# Patient Record
Sex: Male | Born: 1985 | Race: White | Hispanic: No | Marital: Single | Smoking: Current every day smoker
Health system: Southern US, Community
[De-identification: ages and names within clinical notes are randomized; demographics above are authoritative.]

## PROBLEM LIST (undated history)

## (undated) DIAGNOSIS — F32A Depression, unspecified: Secondary | ICD-10-CM

## (undated) DIAGNOSIS — F419 Anxiety disorder, unspecified: Secondary | ICD-10-CM

## (undated) DIAGNOSIS — F329 Major depressive disorder, single episode, unspecified: Secondary | ICD-10-CM

## (undated) DIAGNOSIS — J189 Pneumonia, unspecified organism: Secondary | ICD-10-CM

## (undated) DIAGNOSIS — J45909 Unspecified asthma, uncomplicated: Secondary | ICD-10-CM

## (undated) DIAGNOSIS — Z8489 Family history of other specified conditions: Secondary | ICD-10-CM

## (undated) DIAGNOSIS — IMO0001 Reserved for inherently not codable concepts without codable children: Secondary | ICD-10-CM

## (undated) DIAGNOSIS — G4730 Sleep apnea, unspecified: Secondary | ICD-10-CM

## (undated) DIAGNOSIS — R51 Headache: Secondary | ICD-10-CM

## (undated) DIAGNOSIS — R519 Headache, unspecified: Secondary | ICD-10-CM

---

## 2006-03-19 ENCOUNTER — Emergency Department: Payer: Self-pay | Admitting: Emergency Medicine

## 2006-03-22 ENCOUNTER — Other Ambulatory Visit: Payer: Self-pay

## 2006-03-22 ENCOUNTER — Inpatient Hospital Stay: Payer: Self-pay | Admitting: Internal Medicine

## 2006-03-25 ENCOUNTER — Other Ambulatory Visit: Payer: Self-pay

## 2008-03-20 ENCOUNTER — Emergency Department: Payer: Self-pay | Admitting: Unknown Physician Specialty

## 2009-03-02 ENCOUNTER — Emergency Department: Payer: Self-pay | Admitting: Internal Medicine

## 2011-08-04 ENCOUNTER — Emergency Department: Payer: Self-pay | Admitting: Emergency Medicine

## 2011-08-17 ENCOUNTER — Emergency Department: Payer: Self-pay | Admitting: *Deleted

## 2012-07-01 ENCOUNTER — Emergency Department: Payer: Self-pay | Admitting: Emergency Medicine

## 2013-04-30 ENCOUNTER — Emergency Department: Payer: Self-pay | Admitting: Emergency Medicine

## 2013-07-27 ENCOUNTER — Emergency Department: Payer: Self-pay | Admitting: Emergency Medicine

## 2013-09-30 ENCOUNTER — Emergency Department: Payer: Self-pay | Admitting: Emergency Medicine

## 2014-03-13 ENCOUNTER — Encounter (HOSPITAL_COMMUNITY): Payer: Self-pay | Admitting: Emergency Medicine

## 2014-03-13 ENCOUNTER — Emergency Department (HOSPITAL_COMMUNITY): Payer: Self-pay

## 2014-03-13 ENCOUNTER — Inpatient Hospital Stay (HOSPITAL_COMMUNITY)
Admission: EM | Admit: 2014-03-13 | Discharge: 2014-03-16 | DRG: 493 | Disposition: A | Discharge status: Home / Self Care | Payer: Self-pay | Attending: General Surgery | Admitting: General Surgery

## 2014-03-13 DIAGNOSIS — S32049A Unspecified fracture of fourth lumbar vertebra, initial encounter for closed fracture: Secondary | ICD-10-CM | POA: Diagnosis present

## 2014-03-13 DIAGNOSIS — S82201A Unspecified fracture of shaft of right tibia, initial encounter for closed fracture: Secondary | ICD-10-CM | POA: Diagnosis present

## 2014-03-13 DIAGNOSIS — Z419 Encounter for procedure for purposes other than remedying health state, unspecified: Secondary | ICD-10-CM

## 2014-03-13 DIAGNOSIS — S32009A Unspecified fracture of unspecified lumbar vertebra, initial encounter for closed fracture: Secondary | ICD-10-CM | POA: Diagnosis present

## 2014-03-13 DIAGNOSIS — R52 Pain, unspecified: Secondary | ICD-10-CM

## 2014-03-13 DIAGNOSIS — S32039A Unspecified fracture of third lumbar vertebra, initial encounter for closed fracture: Secondary | ICD-10-CM | POA: Diagnosis present

## 2014-03-13 DIAGNOSIS — S82141A Displaced bicondylar fracture of right tibia, initial encounter for closed fracture: Principal | ICD-10-CM | POA: Diagnosis present

## 2014-03-13 DIAGNOSIS — F329 Major depressive disorder, single episode, unspecified: Secondary | ICD-10-CM | POA: Diagnosis present

## 2014-03-13 DIAGNOSIS — J45909 Unspecified asthma, uncomplicated: Secondary | ICD-10-CM | POA: Diagnosis present

## 2014-03-13 DIAGNOSIS — J9811 Atelectasis: Secondary | ICD-10-CM | POA: Diagnosis not present

## 2014-03-13 DIAGNOSIS — D62 Acute posthemorrhagic anemia: Secondary | ICD-10-CM | POA: Diagnosis not present

## 2014-03-13 DIAGNOSIS — F419 Anxiety disorder, unspecified: Secondary | ICD-10-CM | POA: Diagnosis present

## 2014-03-13 DIAGNOSIS — T07XXXA Unspecified multiple injuries, initial encounter: Secondary | ICD-10-CM

## 2014-03-13 HISTORY — DX: Headache, unspecified: R51.9

## 2014-03-13 HISTORY — DX: Major depressive disorder, single episode, unspecified: F32.9

## 2014-03-13 HISTORY — DX: Headache: R51

## 2014-03-13 HISTORY — DX: Anxiety disorder, unspecified: F41.9

## 2014-03-13 HISTORY — DX: Depression, unspecified: F32.A

## 2014-03-13 HISTORY — DX: Family history of other specified conditions: Z84.89

## 2014-03-13 HISTORY — DX: Unspecified asthma, uncomplicated: J45.909

## 2014-03-13 HISTORY — DX: Pneumonia, unspecified organism: J18.9

## 2014-03-13 HISTORY — DX: Reserved for inherently not codable concepts without codable children: IMO0001

## 2014-03-13 HISTORY — DX: Sleep apnea, unspecified: G47.30

## 2014-03-13 LAB — BASIC METABOLIC PANEL
ANION GAP: 11 (ref 5–15)
BUN: 11 mg/dL (ref 6–23)
CALCIUM: 9.5 mg/dL (ref 8.4–10.5)
CO2: 23 mmol/L (ref 19–32)
CREATININE: 1.13 mg/dL (ref 0.50–1.35)
Chloride: 104 mmol/L (ref 96–112)
GFR calc Af Amer: >90 mL/min (ref >90)
GFR, EST NON AFRICAN AMERICAN: 88 mL/min — AB (ref >90)
Glucose, Bld: 131 mg/dL — ABNORMAL HIGH (ref 70–99)
Potassium: 3.9 mmol/L (ref 3.5–5.1)
SODIUM: 138 mmol/L (ref 135–145)

## 2014-03-13 LAB — CBC WITH DIFFERENTIAL/PLATELET
Basophils Absolute: 0.1 10*3/uL (ref 0.0–0.1)
Basophils Relative: 0 % (ref 0–1)
EOS ABS: 0.5 10*3/uL (ref 0.0–0.7)
EOS PCT: 2 % (ref 0–5)
HCT: 43.2 % (ref 39.0–52.0)
Hemoglobin: 15 g/dL (ref 13.0–17.0)
Lymphocytes Relative: 16 % (ref 12–46)
Lymphs Abs: 4.4 10*3/uL — ABNORMAL HIGH (ref 0.7–4.0)
MCH: 32.1 pg (ref 26.0–34.0)
MCHC: 34.7 g/dL (ref 30.0–36.0)
MCV: 92.3 fL (ref 78.0–100.0)
Monocytes Absolute: 1.3 10*3/uL — ABNORMAL HIGH (ref 0.1–1.0)
Monocytes Relative: 5 % (ref 3–12)
NEUTROS PCT: 78 % — AB (ref 43–77)
Neutro Abs: 22.2 10*3/uL — ABNORMAL HIGH (ref 1.7–7.7)
PLATELETS: 317 10*3/uL (ref 150–400)
RBC: 4.68 MIL/uL (ref 4.22–5.81)
RDW: 12.7 % (ref 11.5–15.5)
WBC: 28.6 10*3/uL — AB (ref 4.0–10.5)

## 2014-03-13 LAB — SAMPLE TO BLOOD BANK

## 2014-03-13 MED ORDER — TETANUS-DIPHTH-ACELL PERTUSSIS 5-2.5-18.5 LF-MCG/0.5 IM SUSP
0.5000 mL | Freq: Once | INTRAMUSCULAR | Status: DC
  Filled 2014-03-13: qty 0.5

## 2014-03-13 MED ORDER — HYDROMORPHONE HCL 1 MG/ML IJ SOLN
1.0000 mg | Freq: Once | INTRAMUSCULAR | Status: AC
  Administered 2014-03-13: 1 mg via INTRAVENOUS
  Filled 2014-03-13: qty 1

## 2014-03-13 MED ORDER — HYDROMORPHONE HCL 1 MG/ML IJ SOLN
1.0000 mg | Freq: Once | INTRAMUSCULAR | Status: AC
  Administered 2014-03-13: 1 mg via INTRAVENOUS

## 2014-03-13 MED ORDER — ONDANSETRON HCL 4 MG/2ML IJ SOLN
INTRAMUSCULAR | Status: AC
  Administered 2014-03-13: 4 mg via INTRAVENOUS
  Filled 2014-03-13: qty 2

## 2014-03-13 MED ORDER — SODIUM CHLORIDE 0.9 % IV BOLUS (SEPSIS)
1000.0000 mL | Freq: Once | INTRAVENOUS | Status: AC
Start: 2014-03-13 — End: 2014-03-14
  Administered 2014-03-13: 1000 mL via INTRAVENOUS

## 2014-03-13 MED ORDER — HYDROMORPHONE HCL 1 MG/ML IJ SOLN
INTRAMUSCULAR | Status: AC
Start: 2014-03-13 — End: 2014-03-14
  Filled 2014-03-13: qty 1

## 2014-03-13 MED ORDER — ONDANSETRON HCL 4 MG/2ML IJ SOLN
4.0000 mg | Freq: Once | INTRAMUSCULAR | Status: AC
  Administered 2014-03-13: 4 mg via INTRAVENOUS

## 2014-03-13 MED ORDER — SODIUM CHLORIDE 0.9 % IV SOLN
Freq: Once | INTRAVENOUS | Status: AC
  Administered 2014-03-13: 21:00:00 via INTRAVENOUS

## 2014-03-13 MED ORDER — HYDROMORPHONE HCL 1 MG/ML IJ SOLN
INTRAMUSCULAR | Status: AC
  Filled 2014-03-13: qty 1

## 2014-03-13 MED ORDER — IOHEXOL 300 MG/ML  SOLN
100.0000 mL | Freq: Once | INTRAMUSCULAR | Status: AC | PRN
  Administered 2014-03-13: 100 mL via INTRAVENOUS

## 2014-03-13 MED ORDER — SODIUM CHLORIDE 0.9 % IV BOLUS (SEPSIS)
1000.0000 mL | Freq: Once | INTRAVENOUS | Status: AC
  Administered 2014-03-13: 1000 mL via INTRAVENOUS
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
## 2014-03-13 NOTE — ED Notes (Signed)
Ortho at bedside placing immobilizer on right leg

## 2014-03-13 NOTE — ED Notes (Signed)
Pt given IV fentanyl by EMS, no LOC

## 2014-03-13 NOTE — ED Notes (Signed)
Pt has good pulses in right foot.

## 2014-03-13 NOTE — ED Notes (Signed)
X-ray at bedside

## 2014-03-13 NOTE — ED Notes (Signed)
Good pulses on left leg, cap refill less than three seconds on left leg. Sensation intact.

## 2014-03-13 NOTE — ED Notes (Signed)
Dr. Erma HeritageIsaacs notified of pt HR.
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
## 2014-03-13 NOTE — Progress Notes (Signed)
Orthopedic Tech Progress Note Patient Details:  Mark FlorChristopher S Carroll 1985/12/16 [id number]  Ortho Devices Type of Ortho Device: Knee Immobilizer Ortho Device/Splint Location: RLE Ortho Device/Splint Interventions: Ordered, Application   Jennye MoccasinHughes, Shaelyn Decarli Craig 03/13/2014, 9:32 PM

## 2014-03-13 NOTE — ED Notes (Signed)
Xray setting up portable xray

## 2014-03-13 NOTE — ED Notes (Signed)
Attempted 2nd IV

## 2014-03-13 NOTE — ED Notes (Signed)
Pt returned from CT transported by this RN. No adverse events in radiology.

## 2014-03-13 NOTE — ED Notes (Signed)
Pt monitored by pulse ox, bp cuff, and 12-lead. 
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
## 2014-03-13 NOTE — ED Notes (Signed)
Pt saturations 88% placed on 2L [place]. Sats 99% on 2L [place].

## 2014-03-13 NOTE — ED Notes (Signed)
Aspen collar applied

## 2014-03-13 NOTE — ED Notes (Signed)
Pulses in right foot are strong.

## 2014-03-13 NOTE — ED Notes (Signed)
This nurse transported pt to CT.
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
## 2014-03-13 NOTE — ED Notes (Signed)
CT notified that patient needs scans STAT.

## 2014-03-13 NOTE — ED Notes (Signed)
Aspen Collar removed by EDP

## 2014-03-14 ENCOUNTER — Inpatient Hospital Stay (HOSPITAL_COMMUNITY): Payer: Self-pay

## 2014-03-14 ENCOUNTER — Encounter (HOSPITAL_COMMUNITY): Payer: Self-pay | Admitting: *Deleted

## 2014-03-14 ENCOUNTER — Inpatient Hospital Stay (HOSPITAL_COMMUNITY): Payer: Self-pay | Admitting: Anesthesiology

## 2014-03-14 ENCOUNTER — Encounter (HOSPITAL_COMMUNITY): Admission: EM | Disposition: A | Discharge status: Home / Self Care | Payer: Self-pay | Source: Home / Self Care

## 2014-03-14 ENCOUNTER — Inpatient Hospital Stay (HOSPITAL_COMMUNITY): Payer: No Typology Code available for payment source | Admitting: Anesthesiology

## 2014-03-14 DIAGNOSIS — S82201A Unspecified fracture of shaft of right tibia, initial encounter for closed fracture: Secondary | ICD-10-CM | POA: Diagnosis present

## 2014-03-14 HISTORY — PX: TIBIA IM NAIL INSERTION: SHX2516

## 2014-03-14 LAB — SURGICAL PCR SCREEN
MRSA, PCR: NEGATIVE
Staphylococcus aureus: POSITIVE — AB

## 2014-03-14 LAB — URINALYSIS, ROUTINE W REFLEX MICROSCOPIC
BILIRUBIN URINE: NEGATIVE
GLUCOSE, UA: NEGATIVE mg/dL
HGB URINE DIPSTICK: NEGATIVE
KETONES UR: NEGATIVE mg/dL
Leukocytes, UA: NEGATIVE
NITRITE: NEGATIVE
PROTEIN: NEGATIVE mg/dL
Specific Gravity, Urine: 1.043 — ABNORMAL HIGH (ref 1.005–1.030)
Urobilinogen, UA: 0.2 mg/dL (ref 0.0–1.0)
pH: 6 (ref 5.0–8.0)

## 2014-03-14 SURGERY — INSERTION, INTRAMEDULLARY ROD, TIBIA
Anesthesia: General | Site: Leg Lower | Laterality: Right

## 2014-03-14 MED ORDER — CEFAZOLIN SODIUM-DEXTROSE 2-3 GM-% IV SOLR
2.0000 g | INTRAVENOUS | Status: AC
  Administered 2014-03-14: 2 g via INTRAVENOUS
  Filled 2014-03-14: qty 50

## 2014-03-14 MED ORDER — NEOSTIGMINE METHYLSULFATE 10 MG/10ML IV SOLN
INTRAVENOUS | Status: AC
  Filled 2014-03-14: qty 4

## 2014-03-14 MED ORDER — PANTOPRAZOLE SODIUM 40 MG IV SOLR
40.0000 mg | Freq: Every day | INTRAVENOUS | Status: DC
  Administered 2014-03-14: 40 mg via INTRAVENOUS
  Filled 2014-03-14: qty 40

## 2014-03-14 MED ORDER — MIDAZOLAM HCL 5 MG/5ML IJ SOLN
INTRAMUSCULAR | Status: DC | PRN
  Administered 2014-03-14: 2 mg via INTRAVENOUS

## 2014-03-14 MED ORDER — BUPIVACAINE HCL (PF) 0.25 % IJ SOLN
INTRAMUSCULAR | Status: AC
  Filled 2014-03-14: qty 30

## 2014-03-14 MED ORDER — HYDROMORPHONE HCL 1 MG/ML IJ SOLN
1.0000 mg | Freq: Once | INTRAMUSCULAR | Status: AC
  Administered 2014-03-14: 1 mg via INTRAVENOUS
  Filled 2014-03-14: qty 1

## 2014-03-14 MED ORDER — FENTANYL CITRATE 0.05 MG/ML IJ SOLN
INTRAMUSCULAR | Status: AC
  Filled 2014-03-14: qty 5

## 2014-03-14 MED ORDER — ROCURONIUM BROMIDE 50 MG/5ML IV SOLN
INTRAVENOUS | Status: AC
  Filled 2014-03-14: qty 1

## 2014-03-14 MED ORDER — OXYCODONE HCL 5 MG PO TABS
5.0000 mg | ORAL_TABLET | ORAL | Status: DC | PRN
  Administered 2014-03-14 – 2014-03-15 (×3): 10 mg via ORAL
  Filled 2014-03-14 (×3): qty 2

## 2014-03-14 MED ORDER — ENOXAPARIN SODIUM 40 MG/0.4ML ~~LOC~~ SOLN
40.0000 mg | SUBCUTANEOUS | Status: DC
  Administered 2014-03-15: 40 mg via SUBCUTANEOUS
  Filled 2014-03-14 (×2): qty 0.4

## 2014-03-14 MED ORDER — FENTANYL CITRATE 0.05 MG/ML IJ SOLN
INTRAMUSCULAR | Status: DC | PRN
  Administered 2014-03-14: 150 ug via INTRAVENOUS
  Administered 2014-03-14 (×2): 100 ug via INTRAVENOUS
  Administered 2014-03-14 (×3): 50 ug via INTRAVENOUS

## 2014-03-14 MED ORDER — MIDAZOLAM HCL 2 MG/2ML IJ SOLN
INTRAMUSCULAR | Status: AC
  Filled 2014-03-14: qty 2

## 2014-03-14 MED ORDER — KETOROLAC TROMETHAMINE 15 MG/ML IJ SOLN
15.0000 mg | Freq: Three times a day (TID) | INTRAMUSCULAR | Status: DC
  Administered 2014-03-14 – 2014-03-15 (×3): 15 mg via INTRAVENOUS
  Filled 2014-03-14 (×3): qty 1

## 2014-03-14 MED ORDER — PROPOFOL 10 MG/ML IV BOLUS
INTRAVENOUS | Status: AC
  Filled 2014-03-14: qty 20

## 2014-03-14 MED ORDER — PROMETHAZINE HCL 25 MG/ML IJ SOLN: 6.2500 mg | INTRAMUSCULAR | Status: DC | PRN

## 2014-03-14 MED ORDER — ONDANSETRON HCL 4 MG PO TABS: 4.0000 mg | ORAL_TABLET | Freq: Four times a day (QID) | ORAL | Status: DC | PRN

## 2014-03-14 MED ORDER — ONDANSETRON HCL 4 MG/2ML IJ SOLN
INTRAMUSCULAR | Status: DC | PRN
  Administered 2014-03-14: 4 mg via INTRAVENOUS

## 2014-03-14 MED ORDER — HYDROMORPHONE HCL 1 MG/ML IJ SOLN
INTRAMUSCULAR | Status: AC
  Filled 2014-03-14: qty 1

## 2014-03-14 MED ORDER — GLYCOPYRROLATE 0.2 MG/ML IJ SOLN
INTRAMUSCULAR | Status: AC
  Filled 2014-03-14: qty 2

## 2014-03-14 MED ORDER — MIDAZOLAM HCL 2 MG/2ML IJ SOLN: 0.5000 mg | Freq: Once | INTRAMUSCULAR | Status: DC | PRN

## 2014-03-14 MED ORDER — CHLORHEXIDINE GLUCONATE 4 % EX LIQD
60.0000 mL | Freq: Once | CUTANEOUS | Status: DC
  Filled 2014-03-14: qty 60

## 2014-03-14 MED ORDER — LIDOCAINE HCL (CARDIAC) 20 MG/ML IV SOLN
INTRAVENOUS | Status: DC | PRN
  Administered 2014-03-14: 20 mg via INTRAVENOUS

## 2014-03-14 MED ORDER — LACTATED RINGERS IV SOLN
INTRAVENOUS | Status: DC | PRN
  Administered 2014-03-14 (×2): via INTRAVENOUS

## 2014-03-14 MED ORDER — HYDROMORPHONE HCL 1 MG/ML IJ SOLN
1.0000 mg | INTRAMUSCULAR | Status: DC | PRN
  Administered 2014-03-14 – 2014-03-15 (×7): 2 mg via INTRAVENOUS
  Filled 2014-03-14 (×7): qty 2

## 2014-03-14 MED ORDER — PANTOPRAZOLE SODIUM 40 MG PO TBEC
40.0000 mg | DELAYED_RELEASE_TABLET | Freq: Every day | ORAL | Status: DC
Start: 2014-03-14 — End: 2014-03-16
  Administered 2014-03-15 – 2014-03-16 (×2): 40 mg via ORAL
  Filled 2014-03-14 (×2): qty 1

## 2014-03-14 MED ORDER — CEFAZOLIN SODIUM-DEXTROSE 2-3 GM-% IV SOLR
2.0000 g | Freq: Three times a day (TID) | INTRAVENOUS | Status: AC
  Administered 2014-03-14 – 2014-03-15 (×3): 2 g via INTRAVENOUS
  Filled 2014-03-14 (×3): qty 50

## 2014-03-14 MED ORDER — GLYCOPYRROLATE 0.2 MG/ML IJ SOLN
INTRAMUSCULAR | Status: DC | PRN
  Administered 2014-03-14: 0.4 mg via INTRAVENOUS

## 2014-03-14 MED ORDER — ONDANSETRON HCL 4 MG/2ML IJ SOLN: 4.0000 mg | Freq: Four times a day (QID) | INTRAMUSCULAR | Status: DC | PRN

## 2014-03-14 MED ORDER — POLYETHYLENE GLYCOL 3350 17 G PO PACK: 17.0000 g | PACK | Freq: Two times a day (BID) | ORAL | Status: DC | PRN

## 2014-03-14 MED ORDER — 0.9 % SODIUM CHLORIDE (POUR BTL) OPTIME
TOPICAL | Status: DC | PRN
  Administered 2014-03-14: 1000 mL

## 2014-03-14 MED ORDER — NEOSTIGMINE METHYLSULFATE 10 MG/10ML IV SOLN
INTRAVENOUS | Status: DC | PRN
  Administered 2014-03-14: 3 mg via INTRAVENOUS

## 2014-03-14 MED ORDER — MUPIROCIN 2 % EX OINT: 1.0000 "application " | TOPICAL_OINTMENT | Freq: Once | CUTANEOUS | Status: DC

## 2014-03-14 MED ORDER — ONDANSETRON HCL 4 MG/2ML IJ SOLN
INTRAMUSCULAR | Status: AC
  Filled 2014-03-14: qty 2

## 2014-03-14 MED ORDER — MEPERIDINE HCL 25 MG/ML IJ SOLN: 6.2500 mg | INTRAMUSCULAR | Status: DC | PRN

## 2014-03-14 MED ORDER — HYDROCODONE-ACETAMINOPHEN 5-325 MG PO TABS: 2.0000 | ORAL_TABLET | ORAL | Status: DC | PRN

## 2014-03-14 MED ORDER — SUCCINYLCHOLINE CHLORIDE 20 MG/ML IJ SOLN
INTRAMUSCULAR | Status: AC
  Filled 2014-03-14: qty 1

## 2014-03-14 MED ORDER — PROPOFOL 10 MG/ML IV BOLUS
INTRAVENOUS | Status: DC | PRN
  Administered 2014-03-14: 200 mg via INTRAVENOUS

## 2014-03-14 MED ORDER — SODIUM CHLORIDE 0.9 % IV SOLN
INTRAVENOUS | Status: DC
  Administered 2014-03-14 (×2): via INTRAVENOUS

## 2014-03-14 MED ORDER — HYDROMORPHONE HCL 1 MG/ML IJ SOLN
0.2500 mg | INTRAMUSCULAR | Status: DC | PRN
  Administered 2014-03-14 (×4): 0.5 mg via INTRAVENOUS

## 2014-03-14 MED ORDER — DEXTROSE 50 % IV SOLN
INTRAVENOUS | Status: AC
  Filled 2014-03-14: qty 50

## 2014-03-14 MED ORDER — DOCUSATE SODIUM 100 MG PO CAPS
100.0000 mg | ORAL_CAPSULE | Freq: Two times a day (BID) | ORAL | Status: DC
  Administered 2014-03-14 – 2014-03-16 (×4): 100 mg via ORAL
  Filled 2014-03-14 (×4): qty 1

## 2014-03-14 MED ORDER — ROCURONIUM BROMIDE 100 MG/10ML IV SOLN
INTRAVENOUS | Status: DC | PRN
  Administered 2014-03-14: 50 mg via INTRAVENOUS

## 2014-03-14 MED ORDER — MUPIROCIN 2 % EX OINT
TOPICAL_OINTMENT | CUTANEOUS | Status: AC
  Administered 2014-03-14: 11:00:00
  Filled 2014-03-14: qty 22

## 2014-03-14 SURGICAL SUPPLY — 68 items
BANDAGE ELASTIC 4 VELCRO ST LF (GAUZE/BANDAGES/DRESSINGS) ×3 IMPLANT
BANDAGE ELASTIC 6 VELCRO ST LF (GAUZE/BANDAGES/DRESSINGS) ×3 IMPLANT
BANDAGE ESMARK 6X9 LF (GAUZE/BANDAGES/DRESSINGS) ×1 IMPLANT
BEAD TIP GUIDEWIRE 2.6MM X 80 CM ×3 IMPLANT
BIOMET TRAUMA BEAD TIP GUIDEWIRE 2.6MMX80CM ×3 IMPLANT
BIT DRILL CALIBRATED 4.3X320MM (BIT) ×1 IMPLANT
BIT DRILL CROWE POINT TWST 4.3 (DRILL) ×1 IMPLANT
BLADE SURG 15 STRL LF DISP TIS (BLADE) ×1 IMPLANT
BLADE SURG 15 STRL SS (BLADE) ×2
BLADE SURG ROTATE 9660 (MISCELLANEOUS) ×3 IMPLANT
BNDG COHESIVE 6X5 TAN STRL LF (GAUZE/BANDAGES/DRESSINGS) ×3 IMPLANT
BNDG ESMARK 6X9 LF (GAUZE/BANDAGES/DRESSINGS) ×3
BNDG GAUZE ELAST 4 BULKY (GAUZE/BANDAGES/DRESSINGS) ×3 IMPLANT
CUFF TOURNIQUET SINGLE 34IN LL (TOURNIQUET CUFF) ×3 IMPLANT
CUFF TOURNIQUET SINGLE 44IN (TOURNIQUET CUFF) IMPLANT
DRAPE C-ARM 42X72 X-RAY (DRAPES) ×3 IMPLANT
DRAPE C-ARMOR (DRAPES) ×3 IMPLANT
DRAPE IMP U-DRAPE 54X76 (DRAPES) ×3 IMPLANT
DRAPE ORTHO SPLIT 77X108 STRL (DRAPES) ×4
DRAPE PROXIMA HALF (DRAPES) ×6 IMPLANT
DRAPE SURG ORHT 6 SPLT 77X108 (DRAPES) ×2 IMPLANT
DRAPE U-SHAPE 47X51 STRL (DRAPES) ×3 IMPLANT
DRILL CALIBRATED 4.3X320MM (BIT) ×3
DRILL CROWE POINT TWIST 4.3 (DRILL) ×3
DURAPREP 26ML APPLICATOR (WOUND CARE) ×6 IMPLANT
ELECT REM PT RETURN 9FT ADLT (ELECTROSURGICAL) ×3
ELECTRODE REM PT RTRN 9FT ADLT (ELECTROSURGICAL) ×1 IMPLANT
GAUZE SPONGE 4X4 12PLY STRL (GAUZE/BANDAGES/DRESSINGS) ×6 IMPLANT
GAUZE XEROFORM 1X8 LF (GAUZE/BANDAGES/DRESSINGS) IMPLANT
GAUZE XEROFORM 5X9 LF (GAUZE/BANDAGES/DRESSINGS) ×3 IMPLANT
GLOVE BIOGEL PI IND STRL 8 (GLOVE) ×2 IMPLANT
GLOVE BIOGEL PI INDICATOR 8 (GLOVE) ×4
GLOVE ECLIPSE 7.5 STRL STRAW (GLOVE) ×12 IMPLANT
GOWN STRL REUS W/ TWL LRG LVL3 (GOWN DISPOSABLE) ×1 IMPLANT
GOWN STRL REUS W/ TWL XL LVL3 (GOWN DISPOSABLE) ×2 IMPLANT
GOWN STRL REUS W/TWL LRG LVL3 (GOWN DISPOSABLE) ×2
GOWN STRL REUS W/TWL XL LVL3 (GOWN DISPOSABLE) ×4
GUIDEWIRE 2.6X80 BEAD TIP (Wire) ×2 IMPLANT
GUIDWIRE 2.6X80 BEAD TIP (Wire) ×6 IMPLANT
KIT BASIN OR (CUSTOM PROCEDURE TRAY) ×3 IMPLANT
KIT ROOM TURNOVER OR (KITS) ×3 IMPLANT
MANIFOLD NEPTUNE II (INSTRUMENTS) ×3 IMPLANT
NEEDLE 22X1 1/2 (OR ONLY) (NEEDLE) ×3 IMPLANT
NS IRRIG 1000ML POUR BTL (IV SOLUTION) ×3 IMPLANT
PACK GENERAL/GYN (CUSTOM PROCEDURE TRAY) ×3 IMPLANT
PACK UNIVERSAL I (CUSTOM PROCEDURE TRAY) ×3 IMPLANT
PAD ABD 8X10 STRL (GAUZE/BANDAGES/DRESSINGS) ×6 IMPLANT
PAD ARMBOARD 7.5X6 YLW CONV (MISCELLANEOUS) ×6 IMPLANT
PIN GUIDE DRILL TIP 2.8X300 (DRILL) ×3 IMPLANT
PLATE TIB 3.5 10H R (Plate) ×3 IMPLANT
SCREW CANN 70X40X6.5 (Screw) ×1 IMPLANT
SCREW CANN 80X40X6.5 NS (Screw) ×1 IMPLANT
SCREW CANNULATED 6.5X65 KNEE (Screw) ×3 IMPLANT
SCREW CANNULATED 6.5X70MM (Screw) ×2 IMPLANT
SCREW CANNULATED 6.5X80MM (Screw) ×2 IMPLANT
SCREW CORT TI DBL LEAD 5X34 (Screw) ×9 IMPLANT
SCREW CORT TI DBL LEAD 5X38 (Screw) ×3 IMPLANT
SCREW CORT TI DBL LEAD 5X60 (Screw) ×6 IMPLANT
SPONGE GAUZE 4X4 12PLY STER LF (GAUZE/BANDAGES/DRESSINGS) ×3 IMPLANT
STAPLER VISISTAT 35W (STAPLE) ×6 IMPLANT
STOCKINETTE IMPERVIOUS LG (DRAPES) ×3 IMPLANT
SUT VIC AB 0 CTB1 27 (SUTURE) ×3 IMPLANT
SUT VIC AB 2-0 CTB1 (SUTURE) ×3 IMPLANT
SYR CONTROL 10ML LL (SYRINGE) ×3 IMPLANT
TOWEL OR 17X24 6PK STRL BLUE (TOWEL DISPOSABLE) ×3 IMPLANT
TOWEL OR 17X26 10 PK STRL BLUE (TOWEL DISPOSABLE) ×3 IMPLANT
WASHER FLAT 6.5MM (Washer) ×6 IMPLANT
WATER STERILE IRR 1000ML POUR (IV SOLUTION) IMPLANT

## 2014-03-14 NOTE — Anesthesia Preprocedure Evaluation (Addendum)
Anesthesia Evaluation  Patient identified by MRN, date of birth, ID band Patient awake    Reviewed: Allergy & Precautions, NPO status , Patient's Chart, lab work & pertinent test results  History of Anesthesia Complications Negative for: history of anesthetic complications  Airway Mallampati: II   Neck ROM: Full    Dental  (+) Teeth Intact, Dental Advisory Given   Pulmonary Current Smoker,          Cardiovascular negative cardio ROS  Rhythm:Regular Rate:Normal     Neuro/Psych  Headaches, Anxiety Depression L4,5 transverse process fracture    GI/Hepatic negative GI ROS, (+)     substance abuse  marijuana use,   Endo/Other  Morbid obesity  Renal/GU negative Renal ROS     Musculoskeletal   Abdominal (+) + obese,   Peds  Hematology negative hematology ROS (+)   Anesthesia Other Findings   Reproductive/Obstetrics                          Anesthesia Physical Anesthesia Plan  ASA: II  Anesthesia Plan: General   Post-op Pain Management:    Induction: Intravenous  Airway Management Planned: Oral ETT  Additional Equipment: None  Intra-op Plan:   Post-operative Plan: Extubation in OR  Informed Consent: I have reviewed the patients History and Physical, chart, labs and discussed the procedure including the risks, benefits and alternatives for the proposed anesthesia with the patient or authorized representative who has indicated his/her understanding and acceptance.   Dental advisory given  Plan Discussed with: CRNA, Anesthesiologist and Surgeon  Anesthesia Plan Comments: (Plan routine monitors, GETA)       Anesthesia Quick Evaluation

## 2014-03-14 NOTE — Anesthesia Procedure Notes (Signed)
Procedure Name: Intubation Date/Time: 03/14/2014 11:18 AM Performed by: Charm BargesBUTLER, Braxtyn Dorff R Pre-anesthesia Checklist: Patient identified, Emergency Drugs available, Suction available, Patient being monitored and Timeout performed Patient Re-evaluated:Patient Re-evaluated prior to inductionOxygen Delivery Method: Circle system utilized Preoxygenation: Pre-oxygenation with 100% oxygen Intubation Type: IV induction Ventilation: Mask ventilation without difficulty Laryngoscope Size: Mac and 4 Grade View: Grade II Tube type: Oral Tube size: 8.0 mm Number of attempts: 1 Airway Equipment and Method: Stylet Placement Confirmation: ETT inserted through vocal cords under direct vision,  positive ETCO2 and breath sounds checked- equal and bilateral Secured at: 23 cm Tube secured with: Tape Dental Injury: Teeth and Oropharynx as per pre-operative assessment
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
## 2014-03-14 NOTE — Brief Op Note (Signed)
03/13/2014 - 03/14/2014  1:21 PM  PATIENT:  Mark Carroll  29 y.o. male  PRE-OPERATIVE DIAGNOSIS:  tibia fracture  POST-OPERATIVE DIAGNOSIS:  tibia fracture  PROCEDURE:  Procedure(s): OPEN REDUCTION INTERNAL FIXATION (ORIF) TIBIAL PLATEAU and IM ROD with tibia (Right)  SURGEON:  Surgeon(s) and Role:    * Harvie JuniorJohn L Coyle Stordahl, MD - Primary  PHYSICIAN ASSISTANT:   ASSISTANTS: bethune   ANESTHESIA:   general  EBL:  Total I/O In: 1000 [I.V.:1000] Out: 275 [Urine:225; Blood:50]  BLOOD ADMINISTERED:none  DRAINS: none   LOCAL MEDICATIONS USED:  NONE  SPECIMEN:  No Specimen  DISPOSITION OF SPECIMEN:  N/A  COUNTS:  YES  TOURNIQUET:   Total Tourniquet Time Documented: Thigh (Right) - 69 minutes Total: Thigh (Right) - 69 minutes   DICTATION: .Other Dictation: Dictation Number [id number]  PLAN OF CARE: Admit to inpatient   PATIENT DISPOSITION:  PACU - hemodynamically stable.   Delay start of Pharmacological VTE agent (>24hrs) due to surgical blood loss or risk of bleeding: no

## 2014-03-14 NOTE — Transfer of Care (Signed)
Immediate Anesthesia Transfer of Care Note  Patient: Mark Carroll  Procedure(s) Performed: Procedure(s): OPEN REDUCTION INTERNAL FIXATION (ORIF) TIBIAL PLATEAU and IM ROD with tibia (Right)  Patient Location: PACU  Anesthesia Type:General  Level of Consciousness: awake, alert  and oriented  Airway & Oxygen Therapy: Patient Spontanous Breathing and Patient connected to nasal cannula oxygen  Post-op Assessment: Report given to RN, Post -op Vital signs reviewed and stable and Patient moving all extremities X 4  Post vital signs: Reviewed and stable  Last Vitals:  Filed Vitals:   03/14/14 0900  BP: 137/90  Pulse: 108  Temp: 36.4 C  Resp: 20    Complications: No apparent anesthesia complications

## 2014-03-14 NOTE — Progress Notes (Signed)
UR completed.  Anarie Kalish, RN BSN [facility] CCM Trauma/Neuro ICU Case Manager [phone]  

## 2014-03-14 NOTE — Anesthesia Postprocedure Evaluation (Signed)
  Anesthesia Post-op Note  Patient: Mark Carroll  Procedure(s) Performed: Procedure(s): OPEN REDUCTION INTERNAL FIXATION (ORIF) TIBIAL PLATEAU and IM ROD with tibia (Right)  Patient Location: PACU  Anesthesia Type:General  Level of Consciousness: awake, alert , oriented and patient cooperative  Airway and Oxygen Therapy: Patient Spontanous Breathing  Post-op Pain: mild  Post-op Assessment: Post-op Vital signs reviewed, Patient's Cardiovascular Status Stable, Respiratory Function Stable, Patent Airway, No signs of Nausea or vomiting and Pain level controlled  Post-op Vital Signs: Reviewed and stable  Last Vitals:  Filed Vitals:   03/14/14 1500  BP:   Pulse: 100  Temp:   Resp:     Complications: No apparent anesthesia complications

## 2014-03-14 NOTE — Progress Notes (Signed)
Received from PACU, hollering in pain, denies nausea, reoriented to room and surroundings,15 family members in room, instructed visitors that it needed to be quiet so pt could rest without disturbance.
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
## 2014-03-15 DIAGNOSIS — D62 Acute posthemorrhagic anemia: Secondary | ICD-10-CM | POA: Diagnosis not present

## 2014-03-15 DIAGNOSIS — J45909 Unspecified asthma, uncomplicated: Secondary | ICD-10-CM | POA: Insufficient documentation

## 2014-03-15 DIAGNOSIS — F32A Depression, unspecified: Secondary | ICD-10-CM | POA: Insufficient documentation

## 2014-03-15 DIAGNOSIS — T07XXXA Unspecified multiple injuries, initial encounter: Secondary | ICD-10-CM

## 2014-03-15 DIAGNOSIS — S32009A Unspecified fracture of unspecified lumbar vertebra, initial encounter for closed fracture: Secondary | ICD-10-CM | POA: Diagnosis present

## 2014-03-15 DIAGNOSIS — F419 Anxiety disorder, unspecified: Secondary | ICD-10-CM | POA: Insufficient documentation

## 2014-03-15 DIAGNOSIS — F329 Major depressive disorder, single episode, unspecified: Secondary | ICD-10-CM | POA: Insufficient documentation

## 2014-03-15 LAB — CBC
HEMATOCRIT: 33.1 % — AB (ref 39.0–52.0)
HEMOGLOBIN: 11.3 g/dL — AB (ref 13.0–17.0)
MCH: 31.1 pg (ref 26.0–34.0)
MCHC: 34.1 g/dL (ref 30.0–36.0)
MCV: 91.2 fL (ref 78.0–100.0)
Platelets: 218 10*3/uL (ref 150–400)
RBC: 3.63 MIL/uL — AB (ref 4.22–5.81)
RDW: 12.8 % (ref 11.5–15.5)
WBC: 15.9 10*3/uL — ABNORMAL HIGH (ref 4.0–10.5)

## 2014-03-15 LAB — BASIC METABOLIC PANEL
Anion gap: 12 (ref 5–15)
CALCIUM: 8.6 mg/dL (ref 8.4–10.5)
CO2: 23 mmol/L (ref 19–32)
Chloride: 101 mmol/L (ref 96–112)
Creatinine, Ser: 1 mg/dL (ref 0.50–1.35)
Glucose, Bld: 120 mg/dL — ABNORMAL HIGH (ref 70–99)
Potassium: 3.3 mmol/L — ABNORMAL LOW (ref 3.5–5.1)
Sodium: 136 mmol/L (ref 135–145)

## 2014-03-15 MED ORDER — HYDROMORPHONE HCL 1 MG/ML IJ SOLN
0.5000 mg | INTRAMUSCULAR | Status: DC | PRN
  Administered 2014-03-15: 0.5 mg via INTRAVENOUS
  Filled 2014-03-15: qty 1

## 2014-03-15 MED ORDER — NAPROXEN 250 MG PO TABS
500.0000 mg | ORAL_TABLET | Freq: Two times a day (BID) | ORAL | Status: DC
  Administered 2014-03-15 – 2014-03-16 (×3): 500 mg via ORAL
  Filled 2014-03-15 (×3): qty 2

## 2014-03-15 MED ORDER — OXYCODONE HCL 5 MG PO TABS
10.0000 mg | ORAL_TABLET | ORAL | Status: DC | PRN
  Administered 2014-03-15: 20 mg via ORAL
  Administered 2014-03-15: 15 mg via ORAL
  Administered 2014-03-15 – 2014-03-16 (×4): 20 mg via ORAL
  Administered 2014-03-16: 10 mg via ORAL
  Administered 2014-03-16: 20 mg via ORAL
  Administered 2014-03-16 (×2): 10 mg via ORAL
  Filled 2014-03-15 (×6): qty 4
  Filled 2014-03-15: qty 2
  Filled 2014-03-15: qty 3
  Filled 2014-03-15: qty 2
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
## 2014-03-15 NOTE — Progress Notes (Signed)
Subjective: 1 Day Post-Op Procedure(s) (LRB): OPEN REDUCTION INTERNAL FIXATION (ORIF) TIBIAL PLATEAU and IM ROD with tibia (Right) Patient reports pain as moderate.  Physical therapy has recommended 1 more day because of some dizziness when he got up and for pain management. He denies numbness in his foot.  Objective: Vital signs in last 24 hours: Temp:  [98.2 F (36.8 C)-99.3 F (37.4 C)] 98.4 F (36.9 C) (03/04 1400) Pulse Rate:  [109-119] 118 (03/04 1400) Resp:  [14-18] 16 (03/04 1400) BP: (132-154)/(63-91) 153/91 mmHg (03/04 1400) SpO2:  [92 %-94 %] 92 % (03/04 1400)  Intake/Output from previous day: 03/03 0701 - 03/04 0700 In: 1716 [I.V.:1716] Out: 2825 [Urine:2775; Blood:50] Intake/Output this shift: Total I/O In: -  Out: 450 [Urine:450]   Recent Labs  03/13/14 2100 03/15/14 0528  HGB 15.0 11.3*    Recent Labs  03/13/14 2100 03/15/14 0528  WBC 28.6* 15.9*  RBC 4.68 3.63*  HCT 43.2 33.1*  PLT 317 218    Recent Labs  03/13/14 2100 03/15/14 0528  NA 138 136  K 3.9 3.3*  CL 104 101  CO2 23 23  BUN 11 <5*  CREATININE 1.13 1.00  GLUCOSE 131* 120*  CALCIUM 9.5 8.6   No results for input(s): LABPT, INR in the last 72 hours. Right lower extremity exam: Dressing clean and dry. Moves foot and ankle without difficulty. Normal sensation in foot. Good capillary refill distally.   Assessment/Plan: 1 Day Post-Op Procedure(s) (LRB): OPEN REDUCTION INTERNAL FIXATION (ORIF) TIBIAL PLATEAU and IM ROD with tibia (Right) Plan: Up with physical therapy touchdown weightbearing on the right. Encouraged ice and significant elevation of his right leg. Okay with plan for discharge home in the a.m. Will need follow-up with Dr. Luiz BlareGraves in 2 weeks.   Mark Carroll 03/15/2014, 3:42 PM

## 2014-03-16 MED ORDER — OXYCODONE HCL 10 MG PO TABS: 10.0000 mg | ORAL_TABLET | ORAL | Status: DC | PRN

## 2014-03-16 NOTE — Progress Notes (Signed)
Subjective: 2 Days Post-Op Procedure(s) (LRB): OPEN REDUCTION INTERNAL FIXATION (ORIF) TIBIAL PLATEAU and IM ROD with tibia (Right) Patient resting comfortably in bed. States that he is ready to go home.  Activity level:  Touch down weight bearing Diet tolerance:  eaing well Voiding:  ok Patient reports pain as mild.    Objective: Vital signs in last 24 hours: Temp:  [98.2 F (36.8 C)-99.1 F (37.3 C)] 98.3 F (36.8 C) (03/05 0551) Pulse Rate:  [111-118] 113 (03/05 0551) Resp:  [16-18] 17 (03/05 0551) BP: (132-153)/(63-91) 141/87 mmHg (03/05 0551) SpO2:  [92 %-95 %] 95 % (03/05 0551)  Labs:  Recent Labs  03/13/14 2100 03/15/14 0528  HGB 15.0 11.3*    Recent Labs  03/13/14 2100 03/15/14 0528  WBC 28.6* 15.9*  RBC 4.68 3.63*  HCT 43.2 33.1*  PLT 317 218    Recent Labs  03/13/14 2100 03/15/14 0528  NA 138 136  K 3.9 3.3*  CL 104 101  CO2 23 23  BUN 11 <5*  CREATININE 1.13 1.00  GLUCOSE 131* 120*  CALCIUM 9.5 8.6   No results for input(s): LABPT, INR in the last 72 hours.  Physical Exam:  Neurologically intact ABD soft Neurovascular intact Sensation intact distally Intact pulses distally Dorsiflexion/Plantar flexion intact Incision: dressing C/D/I and no drainage No cellulitis present Compartment soft  Assessment/Plan:  2 Days Post-Op Procedure(s) (LRB): OPEN REDUCTION INTERNAL FIXATION (ORIF) TIBIAL PLATEAU and IM ROD with tibia (Right) Advance diet Up with therapy  Discharge home today per trauma team.  Follow up in office with Dr. Luiz BlareGraves 2 weeks post op. Continue Ice and elevation. Okay for touchdown weightbearing only at this time.    Katana Berthold, Ginger OrganNDREW PAUL 03/16/2014, 7:24 AM

## 2014-03-16 NOTE — Progress Notes (Addendum)
Physical Therapy Treatment Patient Details Name: Mark Carroll MRN: [id number] DOB: [birth year] Today's Date: 03/16/2014    History of Present Illness      PT Comments    Stair training complete.  D/C plan updated to no PT follow up. Pt will need crutches and w/c for home use. Pt will have 24-hour assist from family at home.  Follow Up Recommendations  No PT follow up;Supervision for mobility/OOB     Equipment Recommendations  Crutches;Wheelchair (measurements PT);Wheelchair cushion (measurements PT)    Recommendations for Other Services       Precautions / Restrictions Precautions Precautions: Fall Restrictions RLE Weight Bearing: Touchdown weight bearing    Mobility  Bed Mobility         Supine to sit: Min guard     General bed mobility comments: verbal cues for sequencing  Transfers   Equipment used: Crutches   Sit to Stand: Min guard Stand pivot transfers: Min guard       General transfer comment: verbal cues for sequencing and safety  Ambulation/Gait Ambulation/Gait assistance: Min guard Ambulation Distance (Feet): 15 Feet (x 2) Assistive device: Crutches Gait Pattern/deviations: Step-to pattern;Antalgic;Decreased stride length Gait velocity: decreased Gait velocity interpretation: Below normal speed for age/gender General Gait Details: Pt able to maintain TDWB RLE.   Stairs  Min assist forward ascent/descent with crutches- 2 steps verbal cues for sequencing and safety. Pt advised to bump up on his bottom, if unsteady and not comfortable going up with crutches, when he arrives home. Pt verbalized understanding and in agreement.          Wheelchair Mobility    Modified Rankin (Stroke Patients Only)       Balance                                    Cognition Arousal/Alertness: Awake/alert Behavior During Therapy: WFL for tasks assessed/performed Overall Cognitive Status: Within Functional Limits for tasks  assessed                      Exercises      General Comments        Pertinent Vitals/Pain Pain Assessment: 0-10 Pain Score: 8  Pain Location: RLE with mobility Pain Intervention(s): Monitored during session;Repositioned    Home Living                      Prior Function            PT Goals (current goals can now be found in the care plan section) Progress towards PT goals: Progressing toward goals    Frequency  Min 5X/week    PT Plan Discharge plan needs to be updated    Co-evaluation             End of Session Equipment Utilized During Treatment: Gait belt Activity Tolerance: Patient limited by pain Patient left: in chair;with family/visitor present;with call bell/phone within reach     Time: 0953-1020 PT Time Calculation (min) (ACUTE ONLY): 27 min  Charges:  $Gait Training: 23-37 mins                    G Codes:      Ilda FoilGarrow, [name] 03/16/2014, 10:36 AM
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
## 2014-03-16 NOTE — Progress Notes (Signed)
Orthopedic Tech Progress Note Patient Details:  Mark FlorChristopher S Carroll Apr 16, 1985 [id number]  Ortho Devices Type of Ortho Device: Crutches Ortho Device/Splint Location: RLE Ortho Device/Splint Interventions: Application   Clotine Heiner 03/16/2014, 10:59 AM

## 2014-03-16 NOTE — Progress Notes (Signed)
OT Cancellation Note  Patient Details Name: Mark Carroll MRN: [id number] DOB: 1985/07/01   Cancelled Treatment:    Reason Eval/Treat Not Completed: OT screened, no needs identified, will sign off. Pt to be seen for toilet transfer today; however pt states he is not having issues with this as long as he has a 3n1 and this has been recommended on eval for pt to have. Also recommended a tub bench but I was able to educate the pt and family (mother--in-law is a CNA) about how pt could sit on side of tub, double bag leg, and sit on shower curtain to shower. Pt reports not issues with LBADLs and family can A prn. No further OT needs, we will sign off.  Evette GeorgesLeonard, Johnelle Tafolla Eva [phone] 03/16/2014, 11:18 AM

## 2014-03-16 NOTE — Discharge Instructions (Signed)
See above

## 2014-03-16 NOTE — Progress Notes (Signed)
Orthopedic Tech Progress Note Patient Details:  Harlon FlorChristopher S Jablonski 28-Jul-1985 [id number]  Patient ID: Harlon Florhristopher S Stroot, male   DOB: 28-Jul-1985, 29 y.o.   MRN: [id number] Viewed order from doctor's order list  Nikki DomCrawford, Vana Arif 03/16/2014, 10:59 AM

## 2014-03-19 ENCOUNTER — Encounter (HOSPITAL_COMMUNITY): Payer: Self-pay | Admitting: Orthopedic Surgery
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
## 2015-02-03 ENCOUNTER — Emergency Department
Admission: EM | Admit: 2015-02-03 | Discharge: 2015-02-03 | Disposition: A | Discharge status: Home / Self Care | Payer: Self-pay | Attending: Emergency Medicine | Admitting: Emergency Medicine

## 2015-02-03 ENCOUNTER — Emergency Department: Payer: Self-pay

## 2015-02-03 DIAGNOSIS — J45901 Unspecified asthma with (acute) exacerbation: Secondary | ICD-10-CM | POA: Insufficient documentation

## 2015-02-03 DIAGNOSIS — R079 Chest pain, unspecified: Secondary | ICD-10-CM

## 2015-02-03 DIAGNOSIS — Z880 Allergy status to penicillin: Secondary | ICD-10-CM | POA: Insufficient documentation

## 2015-02-03 DIAGNOSIS — J159 Unspecified bacterial pneumonia: Secondary | ICD-10-CM | POA: Insufficient documentation

## 2015-02-03 DIAGNOSIS — F17210 Nicotine dependence, cigarettes, uncomplicated: Secondary | ICD-10-CM | POA: Insufficient documentation

## 2015-02-03 DIAGNOSIS — J189 Pneumonia, unspecified organism: Secondary | ICD-10-CM

## 2015-02-03 LAB — COMPREHENSIVE METABOLIC PANEL
ALBUMIN: 4.1 g/dL (ref 3.5–5.0)
ALT: 26 U/L (ref 17–63)
ANION GAP: 8 (ref 5–15)
AST: 21 U/L (ref 15–41)
Alkaline Phosphatase: 113 U/L (ref 38–126)
BILIRUBIN TOTAL: 0.4 mg/dL (ref 0.3–1.2)
BUN: 15 mg/dL (ref 6–20)
CO2: 25 mmol/L (ref 22–32)
Calcium: 9.2 mg/dL (ref 8.9–10.3)
Chloride: 108 mmol/L (ref 101–111)
Creatinine, Ser: 0.97 mg/dL (ref 0.61–1.24)
GFR calc Af Amer: >60 mL/min (ref >60)
GFR calc non Af Amer: >60 mL/min (ref >60)
GLUCOSE: 117 mg/dL — AB (ref 65–99)
Potassium: 3.6 mmol/L (ref 3.5–5.1)
SODIUM: 141 mmol/L (ref 135–145)
TOTAL PROTEIN: 7.3 g/dL (ref 6.5–8.1)

## 2015-02-03 LAB — TROPONIN I
Troponin I: <0.03 ng/mL (ref <0.031)
Troponin I: <0.03 ng/mL (ref <0.031)

## 2015-02-03 LAB — CBC
HCT: 44.7 % (ref 40.0–52.0)
Hemoglobin: 15 g/dL (ref 13.0–18.0)
MCH: 30.7 pg (ref 26.0–34.0)
MCHC: 33.7 g/dL (ref 32.0–36.0)
MCV: 91.2 fL (ref 80.0–100.0)
Platelets: 241 10*3/uL (ref 150–440)
RBC: 4.9 MIL/uL (ref 4.40–5.90)
RDW: 13.5 % (ref 11.5–14.5)
WBC: 13.3 10*3/uL — ABNORMAL HIGH (ref 3.8–10.6)

## 2015-02-03 MED ORDER — AZITHROMYCIN 250 MG PO TABS: ORAL_TABLET | ORAL | Status: AC

## 2015-02-03 NOTE — ED Notes (Signed)
Pt states that he was laying down in the bed and rolled over and felt his heart start a "fluttering" sensation, pt states that he nearly passed out, couldn't moved his arm and just felt really bad with a sharp pain in his chest

## 2015-02-03 NOTE — ED Notes (Signed)
Pt endorses marijuana use last night just prior to experiencing palpations, chest pain, and dyspnea.

## 2015-02-03 NOTE — Discharge Instructions (Signed)
Nonspecific Chest Pain  Chest pain can be caused by many different conditions. There is always a chance that your pain could be related to something serious, such as a heart attack or a blood clot in your lungs. Chest pain can also be caused by conditions that are not life-threatening. If you have chest pain, it is very important to follow up with your health care provider. CAUSES  Chest pain can be caused by:  Heartburn.  Pneumonia or bronchitis.  Anxiety or stress.  Inflammation around your heart (pericarditis) or lung (pleuritis or pleurisy).  A blood clot in your lung.  A collapsed lung (pneumothorax). It can develop suddenly on its own (spontaneous pneumothorax) or from trauma to the chest.  Shingles infection (varicella-zoster virus).  Heart attack.  Damage to the bones, muscles, and cartilage that make up your chest wall. This can include:  Bruised bones due to injury.  Strained muscles or cartilage due to frequent or repeated coughing or overwork.  Fracture to one or more ribs.  Sore cartilage due to inflammation (costochondritis). RISK FACTORS  Risk factors for chest pain may include:  Activities that increase your risk for trauma or injury to your chest.  Respiratory infections or conditions that cause frequent coughing.  Medical conditions or overeating that can cause heartburn.  Heart disease or family history of heart disease.  Conditions or health behaviors that increase your risk of developing a blood clot.  Having had chicken pox (varicella zoster). SIGNS AND SYMPTOMS Chest pain can feel like:  Burning or tingling on the surface of your chest or deep in your chest.  Crushing, pressure, aching, or squeezing pain.  Dull or sharp pain that is worse when you move, cough, or take a deep breath.  Pain that is also felt in your back, neck, shoulder, or arm, or pain that spreads to any of these areas. Your chest pain may come and go, or it may stay  constant. DIAGNOSIS Lab tests or other studies may be needed to find the cause of your pain. Your health care provider may have you take a test called an ambulatory ECG (electrocardiogram). An ECG records your heartbeat patterns at the time the test is performed. You may also have other tests, such as:  Transthoracic echocardiogram (TTE). During echocardiography, sound waves are used to create a picture of all of the heart structures and to look at how blood flows through your heart.  Transesophageal echocardiogram (TEE).This is a more advanced imaging test that obtains images from inside your body. It allows your health care provider to see your heart in finer detail.  Cardiac monitoring. This allows your health care provider to monitor your heart rate and rhythm in real time.  Holter monitor. This is a portable device that records your heartbeat and can help to diagnose abnormal heartbeats. It allows your health care provider to track your heart activity for several days, if needed.  Stress tests. These can be done through exercise or by taking medicine that makes your heart beat more quickly.  Blood tests.  Imaging tests. TREATMENT  Your treatment depends on what is causing your chest pain. Treatment may include:  Medicines. These may include:  Acid blockers for heartburn.  Anti-inflammatory medicine.  Pain medicine for inflammatory conditions.  Antibiotic medicine, if an infection is present.  Medicines to dissolve blood clots.  Medicines to treat coronary artery disease.  Supportive care for conditions that do not require medicines. This may include:  Resting.  Applying heat  or cold packs to injured areas.  Limiting activities until pain decreases. HOME CARE INSTRUCTIONS  If you were prescribed an antibiotic medicine, finish it all even if you start to feel better.  Avoid any activities that bring on chest pain.  Do not use any tobacco products, including  cigarettes, chewing tobacco, or electronic cigarettes. If you need help quitting, ask your health care provider.  Do not drink alcohol.  Take medicines only as directed by your health care provider.  Keep all follow-up visits as directed by your health care provider. This is important. This includes any further testing if your chest pain does not go away.  If heartburn is the cause for your chest pain, you may be told to keep your head raised (elevated) while sleeping. This reduces the chance that acid will go from your stomach into your esophagus.  Make lifestyle changes as directed by your health care provider. These may include:  Getting regular exercise. Ask your health care provider to suggest some activities that are safe for you.  Eating a heart-healthy diet. A registered dietitian can help you to learn healthy eating options.  Maintaining a healthy weight.  Managing diabetes, if necessary.  Reducing stress. SEEK MEDICAL CARE IF:  Your chest pain does not go away after treatment.  You have a rash with blisters on your chest.  You have a fever. SEEK IMMEDIATE MEDICAL CARE IF:   Your chest pain is worse.  You have an increasing cough, or you cough up blood.  You have severe abdominal pain.  You have severe weakness.  You faint.  You have chills.  You have sudden, unexplained chest discomfort.  You have sudden, unexplained discomfort in your arms, back, neck, or jaw.  You have shortness of breath at any time.  You suddenly start to sweat, or your skin gets clammy.  You feel nauseous or you vomit.  You suddenly feel light-headed or dizzy.  Your heart begins to beat quickly, or it feels like it is skipping beats. These symptoms may represent a serious problem that is an emergency. Do not wait to see if the symptoms will go away. Get medical help right away. Call your local emergency services (911 in the [place] Do not drive yourself to the hospital.   This  information is not intended to replace advice given to you by your health care provider. Make sure you discuss any questions you have with your health care provider.   Document Released: 10/07/2004 Document Revised: 01/18/2014 Document Reviewed: 08/03/2013 Elsevier Interactive Patient Education 2016 [employer]  Community-Acquired Pneumonia, Adult Pneumonia is an infection of the lungs. There are different types of pneumonia. One type can develop while a person is in a hospital. A different type, called community-acquired pneumonia, develops in people who are not, or have not recently been, in the hospital or other health care facility.  CAUSES Pneumonia may be caused by bacteria, viruses, or funguses. Community-acquired pneumonia is often caused by Streptococcus pneumonia bacteria. These bacteria are often passed from one person to another by breathing in droplets from the cough or sneeze of an infected person. RISK FACTORS The condition is more likely to develop in:  People who havechronic diseases, such as chronic obstructive pulmonary disease (COPD), asthma, congestive heart failure, cystic fibrosis, diabetes, or kidney disease.  People who haveearly-stage or late-stage HIV.  People who havesickle cell disease.  People who havehad their spleen removed (splenectomy).  People who havepoor Administrator.  People who havemedical conditions  that increase the risk of breathing in (aspirating) secretions their own mouth and nose.   People who havea weakened immune system (immunocompromised).  People who smoke.  People whotravel to areas where pneumonia-causing germs commonly exist.  People whoare around animal habitats or animals that have pneumonia-causing germs, including birds, bats, rabbits, cats, and farm animals. SYMPTOMS Symptoms of this condition include:  Adry cough.  A wet (productive) cough.  Fever.  Sweating.  Chest pain, especially when breathing  deeply or coughing.  Rapid breathing or difficulty breathing.  Shortness of breath.  Shaking chills.  Fatigue.  Muscle aches. DIAGNOSIS Your health care provider will take a medical history and perform a physical exam. You may also have other tests, including:  Imaging studies of your chest, including X-rays.  Tests to check your blood oxygen level and other blood gases.  Other tests on blood, mucus (sputum), fluid around your lungs (pleural fluid), and urine. If your pneumonia is severe, other tests may be done to identify the specific cause of your illness. TREATMENT The type of treatment that you receive depends on many factors, such as the cause of your pneumonia, the medicines you take, and other medical conditions that you have. For most adults, treatment and recovery from pneumonia may occur at home. In some cases, treatment must happen in a hospital. Treatment may include:  Antibiotic medicines, if the pneumonia was caused by bacteria.  Antiviral medicines, if the pneumonia was caused by a virus.  Medicines that are given by mouth or through an IV tube.  Oxygen.  Respiratory therapy. Although rare, treating severe pneumonia may include:  Mechanical ventilation. This is done if you are not breathing well on your own and you cannot maintain a safe blood oxygen level.  Thoracentesis. This procedureremoves fluid around one lung or both lungs to help you breathe better. HOME CARE INSTRUCTIONS  Take over-the-counter and prescription medicines only as told by your health care provider.  Only takecough medicine if you are losing sleep. Understand that cough medicine can prevent your body's natural ability to remove mucus from your lungs.  If you were prescribed an antibiotic medicine, take it as told by your health care provider. Do not stop taking the antibiotic even if you start to feel better.  Sleep in a semi-upright position at night. Try sleeping in a reclining  chair, or place a few pillows under your head.  Do not use tobacco products, including cigarettes, chewing tobacco, and e-cigarettes. If you need help quitting, ask your health care provider.  Drink enough water to keep your urine clear or pale yellow. This will help to thin out mucus secretions in your lungs. PREVENTION There are ways that you can decrease your risk of developing community-acquired pneumonia. Consider getting a pneumococcal vaccine if:  You are older than 29 years of age.  You are older than 29 years of age and are undergoing cancer treatment, have chronic lung disease, or have other medical conditions that affect your immune system. Ask your health care provider if this applies to you. There are different types and schedules of pneumococcal vaccines. Ask your health care provider which vaccination option is best for you. You may also prevent community-acquired pneumonia if you take these actions:  Get an influenza vaccine every year. Ask your health care provider which type of influenza vaccine is best for you.  Go to the dentist on a regular basis.  Wash your hands often. Use hand sanitizer if soap and water are not  available. SEEK MEDICAL CARE IF:  You have a fever.  You are losing sleep because you cannot control your cough with cough medicine. SEEK IMMEDIATE MEDICAL CARE IF:  You have worsening shortness of breath.  You have increased chest pain.  Your sickness becomes worse, especially if you are an older adult or have a weakened immune system.  You cough up blood.   This information is not intended to replace advice given to you by your health care provider. Make sure you discuss any questions you have with your health care provider.   Document Released: 12/28/2004 Document Revised: 09/18/2014 Document Reviewed: 04/24/2014 Elsevier Interactive Patient Education [employer]
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
## 2015-04-29 ENCOUNTER — Emergency Department
Admission: EM | Admit: 2015-04-29 | Discharge: 2015-04-29 | Disposition: A | Discharge status: Home / Self Care | Payer: No Typology Code available for payment source | Attending: Emergency Medicine | Admitting: Emergency Medicine

## 2015-04-29 ENCOUNTER — Emergency Department: Payer: No Typology Code available for payment source

## 2015-04-29 ENCOUNTER — Encounter: Payer: Self-pay | Admitting: Emergency Medicine

## 2015-04-29 DIAGNOSIS — Y999 Unspecified external cause status: Secondary | ICD-10-CM | POA: Insufficient documentation

## 2015-04-29 DIAGNOSIS — F1299 Cannabis use, unspecified with unspecified cannabis-induced disorder: Secondary | ICD-10-CM | POA: Insufficient documentation

## 2015-04-29 DIAGNOSIS — S86911A Strain of unspecified muscle(s) and tendon(s) at lower leg level, right leg, initial encounter: Secondary | ICD-10-CM

## 2015-04-29 DIAGNOSIS — J45909 Unspecified asthma, uncomplicated: Secondary | ICD-10-CM | POA: Insufficient documentation

## 2015-04-29 DIAGNOSIS — Y939 Activity, unspecified: Secondary | ICD-10-CM | POA: Insufficient documentation

## 2015-04-29 DIAGNOSIS — Y929 Unspecified place or not applicable: Secondary | ICD-10-CM | POA: Insufficient documentation

## 2015-04-29 DIAGNOSIS — W109XXA Fall (on) (from) unspecified stairs and steps, initial encounter: Secondary | ICD-10-CM | POA: Insufficient documentation

## 2015-04-29 DIAGNOSIS — F329 Major depressive disorder, single episode, unspecified: Secondary | ICD-10-CM | POA: Insufficient documentation

## 2015-04-29 DIAGNOSIS — M25461 Effusion, right knee: Secondary | ICD-10-CM

## 2015-04-29 DIAGNOSIS — F17210 Nicotine dependence, cigarettes, uncomplicated: Secondary | ICD-10-CM | POA: Insufficient documentation

## 2015-04-29 MED ORDER — IBUPROFEN 800 MG PO TABS: 800.0000 mg | ORAL_TABLET | Freq: Three times a day (TID) | ORAL | Status: AC | PRN

## 2015-04-29 MED ORDER — OXYCODONE-ACETAMINOPHEN 5-325 MG PO TABS: 1.0000 | ORAL_TABLET | ORAL | Status: AC | PRN

## 2015-04-29 MED ORDER — OXYCODONE-ACETAMINOPHEN 5-325 MG PO TABS
2.0000 | ORAL_TABLET | Freq: Once | ORAL | Status: AC
Start: 2015-04-29 — End: 2015-04-29
  Administered 2015-04-29: 2 via ORAL
  Filled 2015-04-29: qty 2
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
## 2015-04-29 NOTE — Discharge Instructions (Signed)
Cryotherapy °Cryotherapy means treatment with cold. Ice or gel packs can be used to reduce both pain and swelling. Ice is the most helpful within the first 24 to 48 hours after an injury or flare-up from overusing a muscle or joint. Sprains, strains, spasms, burning pain, shooting pain, and aches can all be eased with ice. Ice can also be used when recovering from surgery. Ice is effective, has very few side effects, and is safe for most people to use. °PRECAUTIONS  °Ice is not a safe treatment option for people with: °· Raynaud phenomenon. This is a condition affecting small blood vessels in the extremities. Exposure to cold may cause your problems to return. °· Cold hypersensitivity. There are many forms of cold hypersensitivity, including: °¨ Cold urticaria. Red, itchy hives appear on the skin when the tissues begin to warm after being iced. °¨ Cold erythema. This is a red, itchy rash caused by exposure to cold. °¨ Cold hemoglobinuria. Red blood cells break down when the tissues begin to warm after being iced. The hemoglobin that carry oxygen are passed into the urine because they cannot combine with blood proteins fast enough. °· Numbness or altered sensitivity in the area being iced. °If you have any of the following conditions, do not use ice until you have discussed cryotherapy with your caregiver: °· Heart conditions, such as arrhythmia, angina, or chronic heart disease. °· High blood pressure. °· Healing wounds or open skin in the area being iced. °· Current infections. °· Rheumatoid arthritis. °· Poor circulation. °· Diabetes. °Ice slows the blood flow in the region it is applied. This is beneficial when trying to stop inflamed tissues from spreading irritating chemicals to surrounding tissues. However, if you expose your skin to cold temperatures for too long or without the proper protection, you can damage your skin or nerves. Watch for signs of skin damage due to cold. °HOME CARE INSTRUCTIONS °Follow  these tips to use ice and cold packs safely. °· Place a dry or damp towel between the ice and skin. A damp towel will cool the skin more quickly, so you may need to shorten the time that the ice is used. °· For a more rapid response, add gentle compression to the ice. °· Ice for no more than 10 to 20 minutes at a time. The bonier the area you are icing, the less time it will take to get the benefits of ice. °· Check your skin after 5 minutes to make sure there are no signs of a poor response to cold or skin damage. °· Rest 20 minutes or more between uses. °· Once your skin is numb, you can end your treatment. You can test numbness by very lightly touching your skin. The touch should be so light that you do not see the skin dimple from the pressure of your fingertip. When using ice, most people will feel these normal sensations in this order: cold, burning, aching, and numbness. °· Do not use ice on someone who cannot communicate their responses to pain, such as small children or people with dementia. °HOW TO MAKE AN ICE PACK °Ice packs are the most common way to use ice therapy. Other methods include ice massage, ice baths, and cryosprays. Muscle creams that cause a cold, tingly feeling do not offer the same benefits that ice offers and should not be used as a substitute unless recommended by your caregiver. °To make an ice pack, do one of the following: °· Place crushed ice or a   bag of frozen vegetables in a sealable plastic bag. Squeeze out the excess air. Place this bag inside another plastic bag. Slide the bag into a pillowcase or place a damp towel between your skin and the bag. °· Mix 3 parts water with 1 part rubbing alcohol. Freeze the mixture in a sealable plastic bag. When you remove the mixture from the freezer, it will be slushy. Squeeze out the excess air. Place this bag inside another plastic bag. Slide the bag into a pillowcase or place a damp towel between your skin and the bag. °SEEK MEDICAL CARE  IF: °· You develop white spots on your skin. This may give the skin a blotchy (mottled) appearance. °· Your skin turns blue or pale. °· Your skin becomes waxy or hard. °· Your swelling gets worse. °MAKE SURE YOU:  °· Understand these instructions. °· Will watch your condition. °· Will get help right away if you are not doing well or get worse. °  °This information is not intended to replace advice given to you by your health care provider. Make sure you discuss any questions you have with your health care provider. °  °Document Released: 08/24/2010 Document Revised: 01/18/2014 Document Reviewed: 08/24/2010 °Elsevier Interactive Patient Education ©2016 [employer] ° °Knee Effusion °Knee effusion means that you have excess fluid in your knee joint. This can cause pain and swelling in your knee. This may make your knee more difficult to bend and move. That is because there is increased pain and pressure in the joint. If there is fluid in your knee, it often means that something is wrong inside your knee, such as severe arthritis, abnormal inflammation, or an infection. Another common cause of knee effusion is an injury to the knee muscles, ligaments, or cartilage. °HOME CARE INSTRUCTIONS °· Use crutches as directed by your health care provider. °· Wear a knee brace as directed by your health care provider. °· Apply ice to the swollen area: °¨ Put ice in a plastic bag. °¨ Place a towel between your skin and the bag. °¨ Leave the ice on for 20 minutes, 2-3 times per day. °· Keep your knee raised (elevated) when you are sitting or lying down. °· Take medicines only as directed by your health care provider. °· Do any rehabilitation or strengthening exercises as directed by your health care provider. °· Rest your knee as directed by your health care provider. You may start doing your normal activities again when your health care provider approves.    °· Keep all follow-up visits as directed by your health care provider.  This is important. °SEEK MEDICAL CARE IF: °· You have ongoing (persistent) pain in your knee. °SEEK IMMEDIATE MEDICAL CARE IF: °· You have increased swelling or redness of your knee. °· You have severe pain in your knee. °· You have a fever. °  °This information is not intended to replace advice given to you by your health care provider. Make sure you discuss any questions you have with your health care provider. °  °Document Released: 03/20/2003 Document Revised: 01/18/2014 Document Reviewed: 08/13/2013 °Elsevier Interactive Patient Education ©2016 [employer] ° °

## 2015-04-29 NOTE — ED Notes (Signed)
See triage note   States he slipped  Having pain to right knee area   No deformity noted unable to bear wt
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
## 2015-08-10 ENCOUNTER — Encounter: Payer: Self-pay | Admitting: Emergency Medicine

## 2015-08-10 DIAGNOSIS — J45909 Unspecified asthma, uncomplicated: Secondary | ICD-10-CM | POA: Insufficient documentation

## 2015-08-10 DIAGNOSIS — W109XXA Fall (on) (from) unspecified stairs and steps, initial encounter: Secondary | ICD-10-CM | POA: Insufficient documentation

## 2015-08-10 DIAGNOSIS — M25561 Pain in right knee: Secondary | ICD-10-CM | POA: Insufficient documentation

## 2015-08-10 DIAGNOSIS — Y999 Unspecified external cause status: Secondary | ICD-10-CM | POA: Insufficient documentation

## 2015-08-10 DIAGNOSIS — T148 Other injury of unspecified body region: Secondary | ICD-10-CM | POA: Insufficient documentation

## 2015-08-10 DIAGNOSIS — Y9301 Activity, walking, marching and hiking: Secondary | ICD-10-CM | POA: Insufficient documentation

## 2015-08-10 DIAGNOSIS — F17210 Nicotine dependence, cigarettes, uncomplicated: Secondary | ICD-10-CM | POA: Insufficient documentation

## 2015-08-10 DIAGNOSIS — Y92009 Unspecified place in unspecified non-institutional (private) residence as the place of occurrence of the external cause: Secondary | ICD-10-CM | POA: Insufficient documentation

## 2015-08-10 NOTE — ED Triage Notes (Signed)
Patient ambulatory to triage. Patient states that he fell on Friday and has had knee pain since.

## 2015-08-11 ENCOUNTER — Emergency Department: Payer: Self-pay

## 2015-08-11 ENCOUNTER — Emergency Department
Admission: EM | Admit: 2015-08-11 | Discharge: 2015-08-11 | Disposition: A | Discharge status: Home / Self Care | Payer: Self-pay | Attending: Student | Admitting: Student

## 2015-08-11 DIAGNOSIS — M25561 Pain in right knee: Secondary | ICD-10-CM

## 2015-08-11 DIAGNOSIS — W19XXXA Unspecified fall, initial encounter: Secondary | ICD-10-CM

## 2015-08-11 DIAGNOSIS — T148XXA Other injury of unspecified body region, initial encounter: Secondary | ICD-10-CM

## 2015-08-11 NOTE — ED Notes (Signed)
Pt. States he slipped on some wet steps Friday and felt like he twisted his right knee.  No obvious deformity noted to rt. Knee.
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
## 2015-11-29 ENCOUNTER — Encounter: Payer: Self-pay | Admitting: Emergency Medicine

## 2015-11-29 ENCOUNTER — Emergency Department
Admission: EM | Admit: 2015-11-29 | Discharge: 2015-11-29 | Disposition: A | Discharge status: Home / Self Care | Payer: Self-pay | Attending: Emergency Medicine | Admitting: Emergency Medicine

## 2015-11-29 DIAGNOSIS — Y998 Other external cause status: Secondary | ICD-10-CM | POA: Insufficient documentation

## 2015-11-29 DIAGNOSIS — J45909 Unspecified asthma, uncomplicated: Secondary | ICD-10-CM | POA: Insufficient documentation

## 2015-11-29 DIAGNOSIS — S39012A Strain of muscle, fascia and tendon of lower back, initial encounter: Secondary | ICD-10-CM | POA: Insufficient documentation

## 2015-11-29 DIAGNOSIS — F418 Other specified anxiety disorders: Secondary | ICD-10-CM | POA: Insufficient documentation

## 2015-11-29 DIAGNOSIS — Y92830 Public park as the place of occurrence of the external cause: Secondary | ICD-10-CM | POA: Insufficient documentation

## 2015-11-29 DIAGNOSIS — M5432 Sciatica, left side: Secondary | ICD-10-CM

## 2015-11-29 DIAGNOSIS — X58XXXA Exposure to other specified factors, initial encounter: Secondary | ICD-10-CM | POA: Insufficient documentation

## 2015-11-29 DIAGNOSIS — F17210 Nicotine dependence, cigarettes, uncomplicated: Secondary | ICD-10-CM | POA: Insufficient documentation

## 2015-11-29 DIAGNOSIS — Y9389 Activity, other specified: Secondary | ICD-10-CM | POA: Insufficient documentation

## 2015-11-29 DIAGNOSIS — Z791 Long term (current) use of non-steroidal anti-inflammatories (NSAID): Secondary | ICD-10-CM | POA: Insufficient documentation

## 2015-11-29 DIAGNOSIS — M5441 Lumbago with sciatica, right side: Secondary | ICD-10-CM | POA: Insufficient documentation

## 2015-11-29 DIAGNOSIS — M5442 Lumbago with sciatica, left side: Secondary | ICD-10-CM | POA: Insufficient documentation

## 2015-11-29 DIAGNOSIS — M5431 Sciatica, right side: Secondary | ICD-10-CM

## 2015-11-29 MED ORDER — TRAMADOL HCL 50 MG PO TABS
50.0000 mg | ORAL_TABLET | Freq: Once | ORAL | Status: AC
  Administered 2015-11-29: 50 mg via ORAL
  Filled 2015-11-29: qty 1

## 2015-11-29 MED ORDER — TRAMADOL HCL 50 MG PO TABS: 50.0000 mg | ORAL_TABLET | Freq: Four times a day (QID) | ORAL | 0 refills | Status: AC | PRN

## 2015-11-29 MED ORDER — ORPHENADRINE CITRATE 30 MG/ML IJ SOLN
60.0000 mg | Freq: Two times a day (BID) | INTRAMUSCULAR | Status: DC
  Administered 2015-11-29: 60 mg via INTRAMUSCULAR
  Filled 2015-11-29: qty 2

## 2015-11-29 MED ORDER — METHYLPREDNISOLONE 4 MG PO TBPK: ORAL_TABLET | ORAL | 0 refills | Status: AC

## 2015-11-29 MED ORDER — DEXAMETHASONE SODIUM PHOSPHATE 4 MG/ML IJ SOLN
10.0000 mg | Freq: Once | INTRAMUSCULAR | Status: AC
  Administered 2015-11-29: 10 mg via INTRAMUSCULAR
  Filled 2015-11-29: qty 3
  Filled 2015-11-29: qty 1

## 2015-11-29 MED ORDER — CYCLOBENZAPRINE HCL 10 MG PO TABS: 10.0000 mg | ORAL_TABLET | Freq: Three times a day (TID) | ORAL | 0 refills | Status: AC | PRN

## 2015-11-29 NOTE — ED Triage Notes (Signed)
Low back pain radiating to both legs since yesterday. Denies fall or injury.
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
## 2015-11-29 NOTE — ED Notes (Signed)
Pt verbalized understanding of discharge instructions. NAD at this time. 

## 2016-02-22 ENCOUNTER — Encounter: Payer: Self-pay | Admitting: Emergency Medicine

## 2016-02-22 ENCOUNTER — Emergency Department: Payer: Self-pay

## 2016-02-22 ENCOUNTER — Emergency Department
Admission: EM | Admit: 2016-02-22 | Discharge: 2016-02-22 | Disposition: A | Discharge status: Home / Self Care | Payer: Self-pay | Attending: Emergency Medicine | Admitting: Emergency Medicine

## 2016-02-22 DIAGNOSIS — Z79899 Other long term (current) drug therapy: Secondary | ICD-10-CM | POA: Insufficient documentation

## 2016-02-22 DIAGNOSIS — Y999 Unspecified external cause status: Secondary | ICD-10-CM | POA: Insufficient documentation

## 2016-02-22 DIAGNOSIS — J45909 Unspecified asthma, uncomplicated: Secondary | ICD-10-CM | POA: Insufficient documentation

## 2016-02-22 DIAGNOSIS — F17210 Nicotine dependence, cigarettes, uncomplicated: Secondary | ICD-10-CM | POA: Insufficient documentation

## 2016-02-22 DIAGNOSIS — M546 Pain in thoracic spine: Secondary | ICD-10-CM | POA: Insufficient documentation

## 2016-02-22 DIAGNOSIS — Y9389 Activity, other specified: Secondary | ICD-10-CM | POA: Insufficient documentation

## 2016-02-22 DIAGNOSIS — F1290 Cannabis use, unspecified, uncomplicated: Secondary | ICD-10-CM | POA: Insufficient documentation

## 2016-02-22 DIAGNOSIS — S0083XA Contusion of other part of head, initial encounter: Secondary | ICD-10-CM | POA: Insufficient documentation

## 2016-02-22 DIAGNOSIS — Y929 Unspecified place or not applicable: Secondary | ICD-10-CM | POA: Insufficient documentation

## 2016-02-22 NOTE — ED Notes (Signed)
Pt attempting to walk out to lobby to leave. Pt returned to room and states that he will stay for CT to be read. Given warm blanket.
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
## 2016-02-22 NOTE — ED Notes (Signed)
Pt ambulates with ease.

## 2016-02-22 NOTE — Discharge Instructions (Signed)
Please seek medical attention for any high fevers, chest pain, shortness of breath, change in behavior, persistent vomiting, bloody stool or any other new or concerning symptoms.  
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
## 2016-02-22 NOTE — ED Notes (Signed)
PT back from CT

## 2016-02-22 NOTE — ED Notes (Signed)
Pt alert and oriented X4, active, cooperative, pt in NAD. RR even and unlabored, color WNL.  Pt informed to return if any life threatening symptoms occur.   

## 2017-06-10 ENCOUNTER — Other Ambulatory Visit: Payer: Self-pay

## 2017-06-10 ENCOUNTER — Emergency Department: Payer: Self-pay

## 2017-06-10 ENCOUNTER — Encounter: Payer: Self-pay | Admitting: Emergency Medicine

## 2017-06-10 ENCOUNTER — Emergency Department
Admission: EM | Admit: 2017-06-10 | Discharge: 2017-06-10 | Disposition: A | Discharge status: Home / Self Care | Payer: Self-pay | Attending: Student in an Organized Health Care Education/Training Program | Admitting: Student in an Organized Health Care Education/Training Program

## 2017-06-10 DIAGNOSIS — J45909 Unspecified asthma, uncomplicated: Secondary | ICD-10-CM | POA: Insufficient documentation

## 2017-06-10 DIAGNOSIS — F17210 Nicotine dependence, cigarettes, uncomplicated: Secondary | ICD-10-CM | POA: Insufficient documentation

## 2017-06-10 DIAGNOSIS — M79675 Pain in left toe(s): Secondary | ICD-10-CM | POA: Insufficient documentation

## 2017-06-10 DIAGNOSIS — Z79899 Other long term (current) drug therapy: Secondary | ICD-10-CM | POA: Insufficient documentation

## 2017-06-10 MED ORDER — NAPROXEN 500 MG PO TABS: 500.0000 mg | ORAL_TABLET | Freq: Two times a day (BID) | ORAL | 0 refills | Status: AC

## 2017-06-10 MED ORDER — NAPROXEN 500 MG PO TABS
500.0000 mg | ORAL_TABLET | Freq: Once | ORAL | Status: AC
  Administered 2017-06-10: 500 mg via ORAL
  Filled 2017-06-10: qty 1
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
## 2017-06-10 NOTE — ED Triage Notes (Signed)
C/o pain to right great toe and into foot at proximal end of toe.  Denies injury. Ambulatory. VSS

## 2017-06-10 NOTE — ED Notes (Signed)
Reviewed discharge instructions, follow-up care, and prescriptions with patient. Patient verbalized understanding of all information reviewed. Patient stable, with no distress noted at this time.    

## 2017-06-10 NOTE — ED Notes (Signed)
Pt stating pain in his 1st toe on his right foot. Pt stating pain started yesterday. Pt stating that it was hurting but became worse today. Pt stating no known injuries. Pt stating that the pain is sharp. No redness or edema noted to toe.

## 2017-10-11 DEATH — deceased
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
## 2019-12-23 IMAGING — CR DG FOOT COMPLETE 3+V*R*
1 series · 3 of 3 positions shown · non-contrast
Comparison: None.

CLINICAL DATA: Pain in right great toe without trauma.

EXAM:
RIGHT FOOT COMPLETE - 3+ VIEW

[Series 1: dg foot complete right · 0.14mm/px · 3 of 3 slices shown]
[im 1/3]
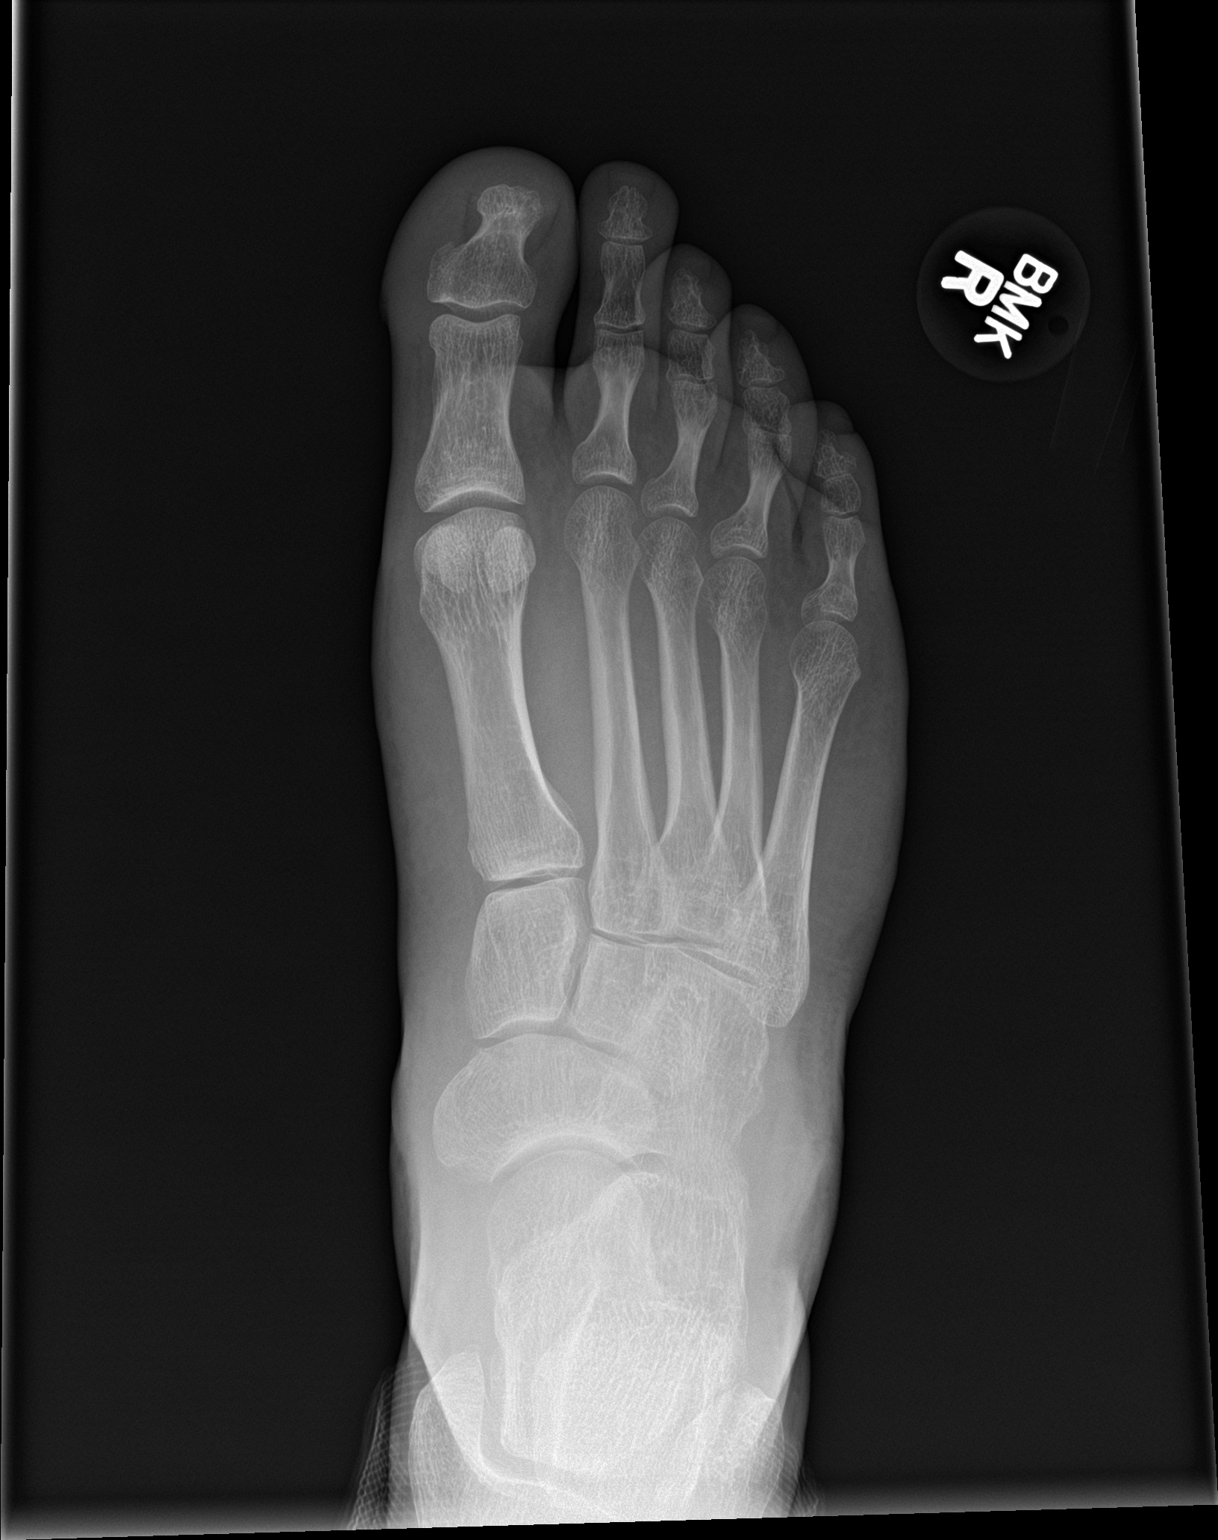
[im 2/3]
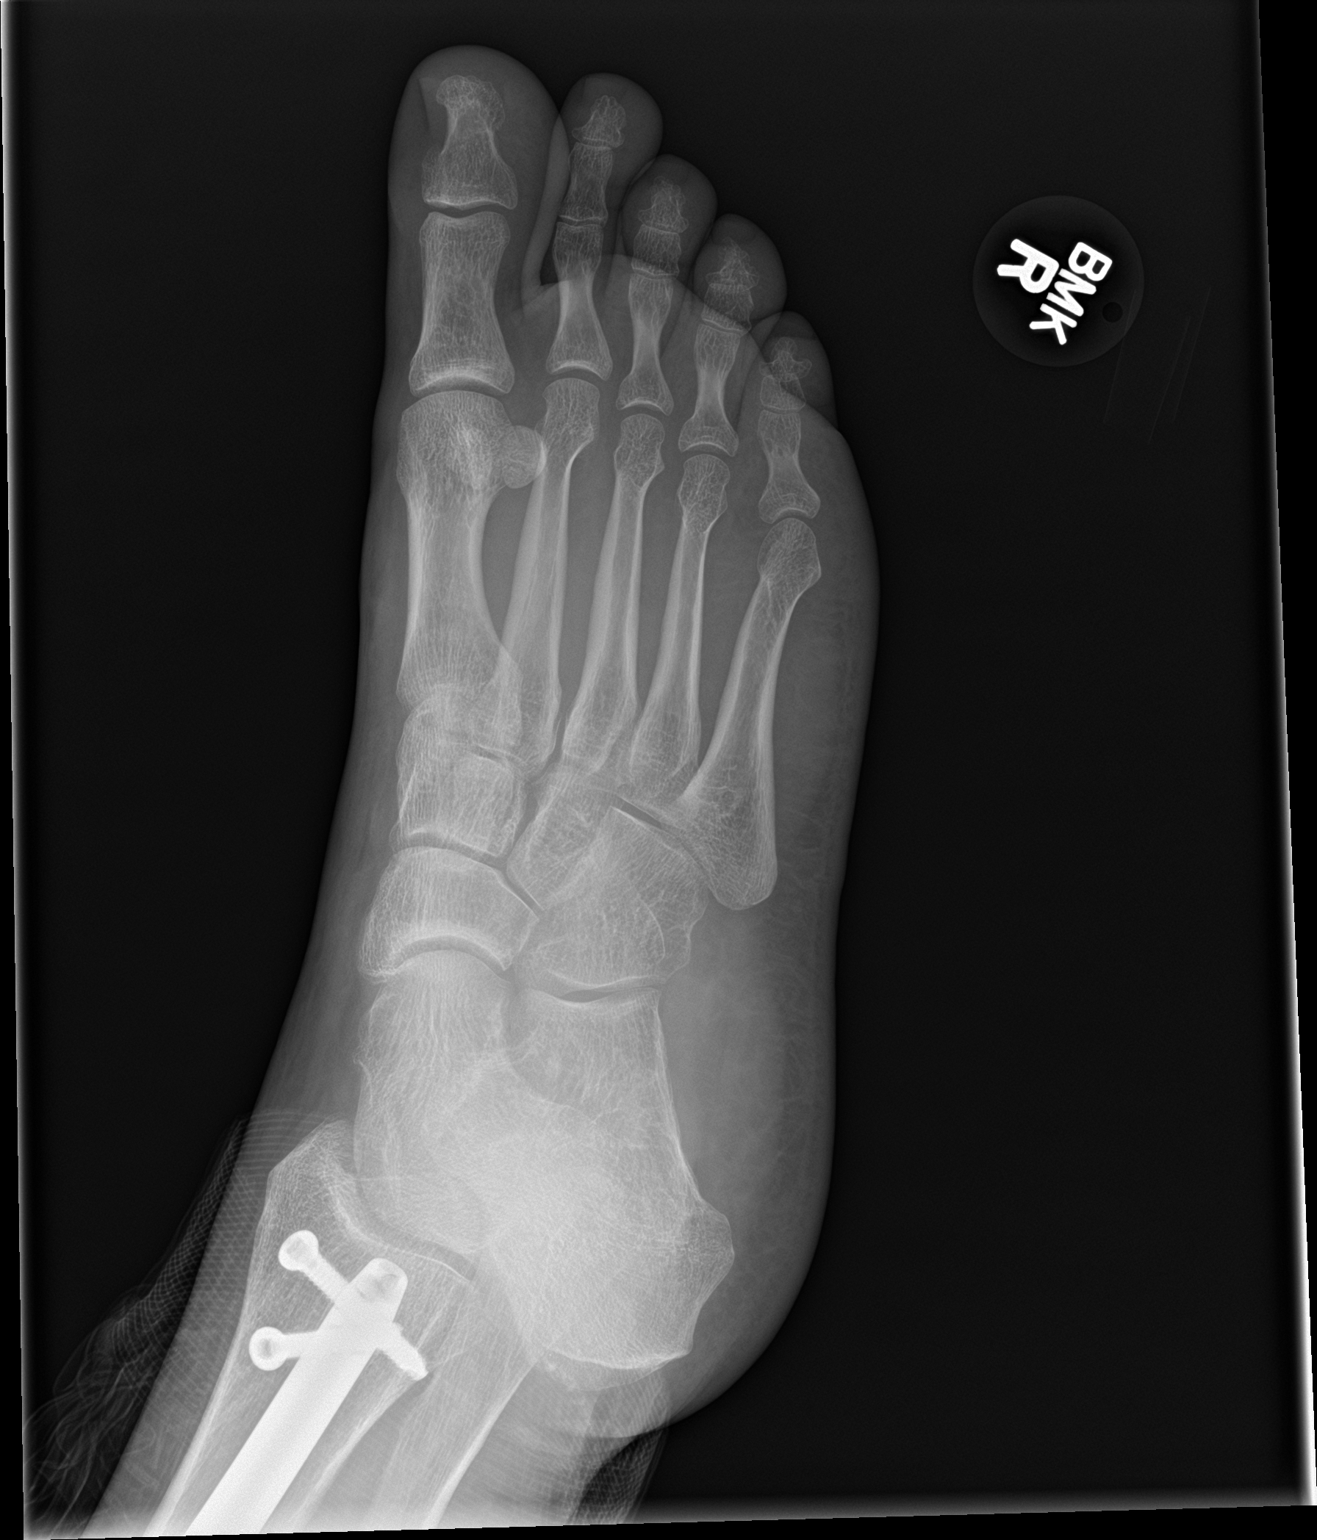
[im 3/3]
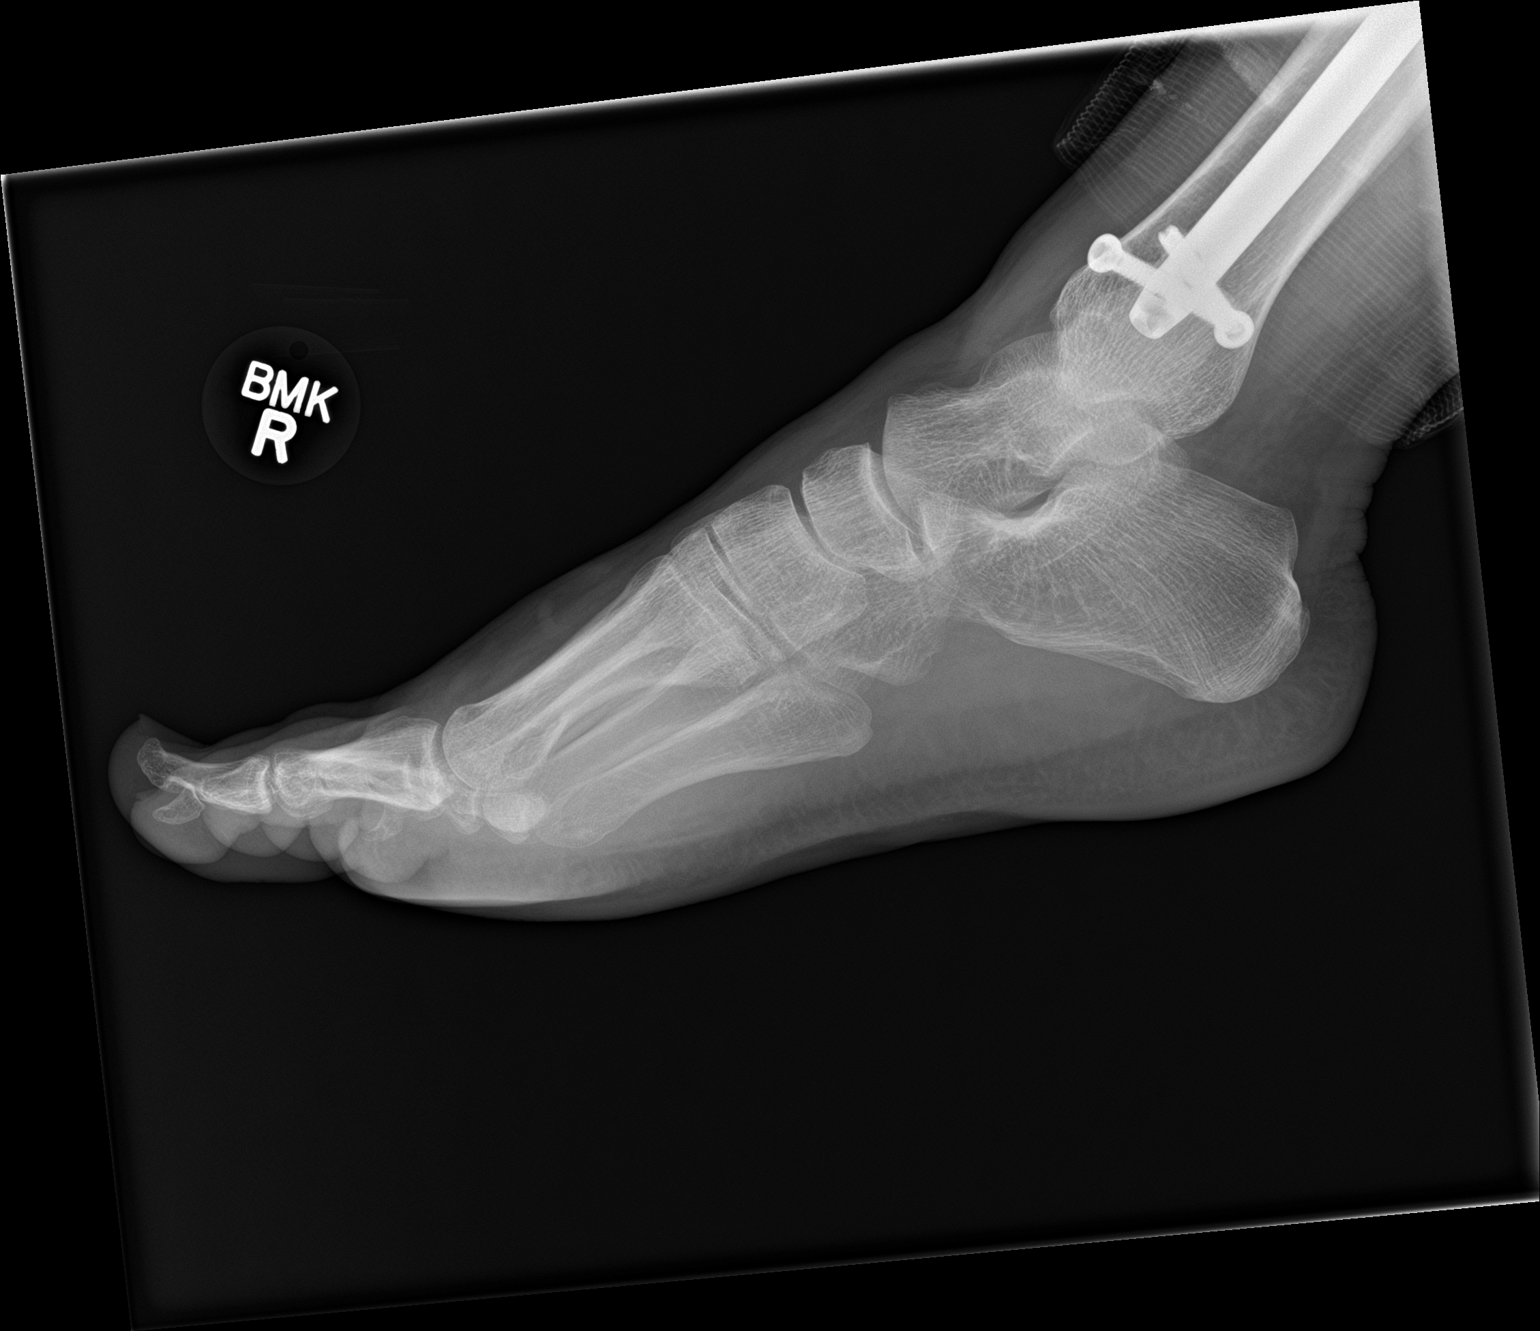

[3 of 3 positions shown; findings below may reference images not displayed]

FINDINGS: There is no evidence of fracture or dislocation. There is no
evidence of arthropathy or other focal bone abnormality. Soft
tissues are unremarkable.
IMPRESSION: Negative.
